# Patient Record
Sex: Female | Born: 1980 | Hispanic: Yes | Marital: Married | Smoking: Never smoker
Health system: Southern US, Community
[De-identification: ages and names within clinical notes are randomized; demographics above are authoritative.]

## PROBLEM LIST (undated history)

## (undated) ENCOUNTER — Inpatient Hospital Stay (HOSPITAL_COMMUNITY): Admission: AD | Payer: Self-pay | Admitting: Obstetrics & Gynecology

## (undated) DIAGNOSIS — E785 Hyperlipidemia, unspecified: Secondary | ICD-10-CM

## (undated) DIAGNOSIS — Z789 Other specified health status: Secondary | ICD-10-CM

## (undated) DIAGNOSIS — D279 Benign neoplasm of unspecified ovary: Secondary | ICD-10-CM

## (undated) HISTORY — DX: Hyperlipidemia, unspecified: E78.5

## (undated) HISTORY — DX: Other specified health status: Z78.9

## (undated) HISTORY — DX: Morbid (severe) obesity due to excess calories: E66.01

## (undated) HISTORY — PX: NO PAST SURGERIES: SHX2092

## (undated) HISTORY — DX: Benign neoplasm of unspecified ovary: D27.9

---

## 2000-02-24 ENCOUNTER — Ambulatory Visit (HOSPITAL_COMMUNITY): Admission: RE | Admit: 2000-02-24 | Discharge: 2000-02-24 | Payer: Self-pay | Admitting: *Deleted

## 2000-02-29 ENCOUNTER — Inpatient Hospital Stay (HOSPITAL_COMMUNITY): Admission: AD | Admit: 2000-02-29 | Discharge: 2000-02-29 | Payer: Self-pay | Admitting: *Deleted

## 2000-06-01 ENCOUNTER — Ambulatory Visit (HOSPITAL_COMMUNITY): Admission: RE | Admit: 2000-06-01 | Discharge: 2000-06-01 | Payer: Self-pay | Admitting: *Deleted

## 2000-07-13 ENCOUNTER — Inpatient Hospital Stay (HOSPITAL_COMMUNITY): Admission: AD | Admit: 2000-07-13 | Discharge: 2000-07-13 | Payer: Self-pay | Admitting: Obstetrics

## 2000-07-13 ENCOUNTER — Inpatient Hospital Stay (HOSPITAL_COMMUNITY): Admission: AD | Admit: 2000-07-13 | Discharge: 2000-07-15 | Payer: Self-pay | Admitting: Obstetrics

## 2000-07-29 ENCOUNTER — Inpatient Hospital Stay (HOSPITAL_COMMUNITY): Admission: AD | Admit: 2000-07-29 | Discharge: 2000-07-29 | Payer: Self-pay | Admitting: Obstetrics & Gynecology

## 2000-11-13 ENCOUNTER — Encounter: Payer: Self-pay | Admitting: Emergency Medicine

## 2000-11-13 ENCOUNTER — Emergency Department (HOSPITAL_COMMUNITY): Admission: EM | Admit: 2000-11-13 | Discharge: 2000-11-13 | Payer: Self-pay | Admitting: Emergency Medicine

## 2000-12-22 ENCOUNTER — Emergency Department (HOSPITAL_COMMUNITY): Admission: EM | Admit: 2000-12-22 | Discharge: 2000-12-23 | Payer: Self-pay | Admitting: Emergency Medicine

## 2000-12-23 ENCOUNTER — Encounter: Payer: Self-pay | Admitting: Emergency Medicine

## 2002-05-22 ENCOUNTER — Inpatient Hospital Stay (HOSPITAL_COMMUNITY): Admission: AD | Admit: 2002-05-22 | Discharge: 2002-05-22 | Payer: Self-pay | Admitting: *Deleted

## 2003-02-21 ENCOUNTER — Other Ambulatory Visit: Admission: RE | Admit: 2003-02-21 | Discharge: 2003-02-21 | Payer: Self-pay | Admitting: Obstetrics and Gynecology

## 2003-02-27 ENCOUNTER — Inpatient Hospital Stay (HOSPITAL_COMMUNITY): Admission: AD | Admit: 2003-02-27 | Discharge: 2003-02-27 | Payer: Self-pay | Admitting: Obstetrics and Gynecology

## 2003-09-28 ENCOUNTER — Inpatient Hospital Stay (HOSPITAL_COMMUNITY): Admission: AD | Admit: 2003-09-28 | Discharge: 2003-09-28 | Payer: Self-pay | Admitting: Obstetrics & Gynecology

## 2003-09-29 ENCOUNTER — Inpatient Hospital Stay (HOSPITAL_COMMUNITY): Admission: AD | Admit: 2003-09-29 | Discharge: 2003-09-29 | Payer: Self-pay | Admitting: Obstetrics & Gynecology

## 2003-10-01 ENCOUNTER — Encounter: Admission: RE | Admit: 2003-10-01 | Discharge: 2003-10-01 | Payer: Self-pay | Admitting: *Deleted

## 2003-10-03 ENCOUNTER — Inpatient Hospital Stay (HOSPITAL_COMMUNITY): Admission: AD | Admit: 2003-10-03 | Discharge: 2003-10-05 | Payer: Self-pay | Admitting: Obstetrics and Gynecology

## 2006-09-23 ENCOUNTER — Inpatient Hospital Stay (HOSPITAL_COMMUNITY): Admission: AD | Admit: 2006-09-23 | Discharge: 2006-09-23 | Payer: Self-pay | Admitting: Gynecology

## 2006-10-05 ENCOUNTER — Inpatient Hospital Stay (HOSPITAL_COMMUNITY): Admission: AD | Admit: 2006-10-05 | Discharge: 2006-10-05 | Payer: Self-pay | Admitting: Obstetrics & Gynecology

## 2006-11-02 ENCOUNTER — Ambulatory Visit: Payer: Self-pay | Admitting: Obstetrics and Gynecology

## 2010-10-03 ENCOUNTER — Encounter: Payer: Self-pay | Admitting: *Deleted

## 2010-10-04 ENCOUNTER — Encounter: Payer: Self-pay | Admitting: Family Medicine

## 2016-11-12 ENCOUNTER — Emergency Department (HOSPITAL_COMMUNITY)
Admission: EM | Admit: 2016-11-12 | Discharge: 2016-11-13 | Disposition: A | Discharge status: Home / Self Care | Payer: No Typology Code available for payment source | Attending: Emergency Medicine | Admitting: Emergency Medicine

## 2016-11-12 ENCOUNTER — Emergency Department (HOSPITAL_COMMUNITY): Payer: No Typology Code available for payment source

## 2016-11-12 ENCOUNTER — Encounter (HOSPITAL_COMMUNITY): Payer: Self-pay | Admitting: Emergency Medicine

## 2016-11-12 DIAGNOSIS — N390 Urinary tract infection, site not specified: Secondary | ICD-10-CM | POA: Diagnosis not present

## 2016-11-12 DIAGNOSIS — M25561 Pain in right knee: Secondary | ICD-10-CM | POA: Diagnosis not present

## 2016-11-12 DIAGNOSIS — R933 Abnormal findings on diagnostic imaging of other parts of digestive tract: Secondary | ICD-10-CM | POA: Diagnosis not present

## 2016-11-12 DIAGNOSIS — R935 Abnormal findings on diagnostic imaging of other abdominal regions, including retroperitoneum: Secondary | ICD-10-CM

## 2016-11-12 DIAGNOSIS — Y939 Activity, unspecified: Secondary | ICD-10-CM | POA: Diagnosis not present

## 2016-11-12 DIAGNOSIS — R109 Unspecified abdominal pain: Secondary | ICD-10-CM | POA: Diagnosis present

## 2016-11-12 DIAGNOSIS — M25562 Pain in left knee: Secondary | ICD-10-CM | POA: Insufficient documentation

## 2016-11-12 DIAGNOSIS — Y92410 Unspecified street and highway as the place of occurrence of the external cause: Secondary | ICD-10-CM | POA: Diagnosis not present

## 2016-11-12 DIAGNOSIS — M549 Dorsalgia, unspecified: Secondary | ICD-10-CM | POA: Diagnosis not present

## 2016-11-12 DIAGNOSIS — Y999 Unspecified external cause status: Secondary | ICD-10-CM | POA: Diagnosis not present

## 2016-11-12 DIAGNOSIS — M542 Cervicalgia: Secondary | ICD-10-CM | POA: Insufficient documentation

## 2016-11-12 LAB — I-STAT CHEM 8, ED
BUN: 13 mg/dL (ref 6–20)
CALCIUM ION: 1.11 mmol/L — AB (ref 1.15–1.40)
Chloride: 105 mmol/L (ref 101–111)
Creatinine, Ser: 0.5 mg/dL (ref 0.44–1.00)
Glucose, Bld: 109 mg/dL — ABNORMAL HIGH (ref 65–99)
HEMATOCRIT: 33 % — AB (ref 36.0–46.0)
HEMOGLOBIN: 11.2 g/dL — AB (ref 12.0–15.0)
Potassium: 3.4 mmol/L — ABNORMAL LOW (ref 3.5–5.1)
SODIUM: 141 mmol/L (ref 135–145)
TCO2: 25 mmol/L (ref 0–100)

## 2016-11-12 LAB — URINALYSIS, ROUTINE W REFLEX MICROSCOPIC
BILIRUBIN URINE: NEGATIVE
Glucose, UA: NEGATIVE mg/dL
Ketones, ur: NEGATIVE mg/dL
Nitrite: NEGATIVE
PROTEIN: 30 mg/dL — AB
SPECIFIC GRAVITY, URINE: 1.026 (ref 1.005–1.030)
pH: 7 (ref 5.0–8.0)

## 2016-11-12 LAB — POC URINE PREG, ED: PREG TEST UR: NEGATIVE

## 2016-11-12 LAB — I-STAT BETA HCG BLOOD, ED (MC, WL, AP ONLY): I-stat hCG, quantitative: 5.2 m[IU]/mL — ABNORMAL HIGH (ref <5)

## 2016-11-12 MED ORDER — HYDROCODONE-ACETAMINOPHEN 5-325 MG PO TABS
1.0000 | ORAL_TABLET | Freq: Once | ORAL | Status: AC
  Administered 2016-11-12: 1 via ORAL
  Filled 2016-11-12: qty 1

## 2016-11-12 MED ORDER — IOPAMIDOL (ISOVUE-300) INJECTION 61%
INTRAVENOUS | Status: AC
  Filled 2016-11-12: qty 100

## 2016-11-12 MED ORDER — IOPAMIDOL (ISOVUE-300) INJECTION 61%
100.0000 mL | Freq: Once | INTRAVENOUS | Status: AC | PRN
  Administered 2016-11-12: 100 mL via INTRAVENOUS
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
## 2016-11-12 NOTE — Discharge Instructions (Signed)
Take ibuprofen and flexeril as needed for your pain.  You have a urinary tract infection, take antibiotic as prescribed for the full duration. On your abdominal and pelvis CT scan, there's evidence of a right adnexal cyst measuring 8.1 x 3.8 cm.  This is an incidental finding however it is recommended that you discussed this finding with your primary care provider and request further evaluation which may include an MRI.

## 2016-11-12 NOTE — ED Notes (Signed)
Pt aware we need urine specimen to check preg status.  Will try in about ten minutes or so.
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
## 2016-11-13 MED ORDER — IBUPROFEN 800 MG PO TABS: 800.0000 mg | ORAL_TABLET | Freq: Three times a day (TID) | ORAL | 0 refills | Status: DC

## 2016-11-13 MED ORDER — CYCLOBENZAPRINE HCL 10 MG PO TABS: 10.0000 mg | ORAL_TABLET | Freq: Two times a day (BID) | ORAL | 0 refills | Status: DC | PRN

## 2016-11-13 MED ORDER — SULFAMETHOXAZOLE-TRIMETHOPRIM 800-160 MG PO TABS: 1.0000 | ORAL_TABLET | Freq: Two times a day (BID) | ORAL | 0 refills | Status: AC
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
## 2016-11-29 ENCOUNTER — Encounter: Payer: Self-pay | Admitting: Obstetrics & Gynecology

## 2016-11-29 ENCOUNTER — Ambulatory Visit (INDEPENDENT_AMBULATORY_CARE_PROVIDER_SITE_OTHER): Payer: Self-pay | Admitting: Obstetrics & Gynecology

## 2016-11-29 VITALS — BP 149/93 | HR 96 | Ht 59.45 in | Wt 223.1 lb

## 2016-11-29 DIAGNOSIS — N83201 Unspecified ovarian cyst, right side: Secondary | ICD-10-CM
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
## 2016-11-29 NOTE — Patient Instructions (Signed)
Quiste ovrico (Ovarian Cyst) Un quiste ovrico es una bolsa llena de lquido que se forma en el ovario. Los ovarios son los rganos pequeos que producen vulos en las mujeres. Se pueden formar varios tipos de [employer]. Algunos pueden provocar sntomas y requerir Clinical research associate. La mayora de los quistes ovricos desaparecen por s solos, no son cancerosos (son benignos) y no causan problemas. Los tipos ms comunes de quistes ovricos son los siguientes:  Quistes funcionales (folculos).  Ocurren durante el ciclo menstrual y suelen desaparecer con el siguiente ciclo menstrual si no queda embarazada.  No suelen causar sntomas.  Endometriomas.  Son quistes formados por el tejido que recubre el tero (endometrio).  A veces se denominan "quistes de chocolate" porque se llenan de sangre que se vuelve marrn.  Este tipo de quiste puede provocar dolor en la zona inferior del abdomen durante la relacin sexual y el perodo menstrual.  Cistoadenomas.  Se desarrollan a partir de clulas que se encuentran en la superficie externa del ovario.  Pueden agrandarse mucho y causar dolor en la zona inferior del abdomen y con la relacin sexual.  Pueden provocar dolor intenso si se tuercen o se rompen (ruptura).  Quistes dermoides.  A veces se encuentran en ambos ovarios.  Estos quistes pueden [employer] tipos de tejidos del organismo, como piel, dientes, pelo o Database administrator.  Generalmente no tienen sntomas, a menos que sean muy grandes.  Quistes tecalutesticos.  Aparecen cuando se produce demasiada cantidad de cierta hormona (gonadotropina corinica humana) que estimula en exceso al ovario para que produzca vulos.  Son ms frecuentes despus de procedimientos que ayudan a la concepcin de un beb (fertilizacin in vitro). CAUSAS Algunas de las causas de los quistes ovricos son las siguientes:  Sndrome de hiperestimulacin ovrica. Esta es una afeccin que puede  aparecer debido a la toma de medicamentos para la fertilidad. Provoca la formacin de varios quistes ovricos grandes.  Sndrome de ovario poliqustico (SOP). Este es un trastorno hormonal frecuente que puede producir quistes ovricos, adems de problemas en el perodo o la fertilidad. Zortman factores pueden hacer que usted sea ms propensa a desarrollar quistes ovricos:  Tener exceso de [place] u obesidad.  Tomar medicamentos para la fertilidad.  Usar ciertas formas de regulacin hormonal de la natalidad.  Fumar. SNTOMAS Muchos quistes ovricos no causan sntomas. Si se presentan sntomas, estos pueden ser:  Dolor o molestias en la pelvis.  Dolor en la parte baja del abdomen.  [place].  [place]inch[place]n abdominal.  Perodos menstruales anormales.  Aumento del [employer] perodos [place]. DIAGNSTICO Estos quistes se descubren comnmente durante un examen de rutina o una exploracin ginecolgica. Puede realizarse exmenes para obtener ms informacin sobre el [place], como los siguientes:  Magazine features editor.  Radiografas de la pelvis.  Tomografa computarizada (TC).  Resonancia magntica (RM).  Anlisis de Jacobus. TRATAMIENTO Muchos de los quistes ovricos desaparecen por s solos, sin tratamiento. Es probable que el mdico quiera controlar el quiste regularmente durante 2 o 57mses para ver si se produce algn cambio. Si est en la menopausia, es especialmente importante controlar el quiste cuidadosamente porque las mujeres menopusicas tienen un ndice mayor de cncer de ovario. Si se necesita tratamiento, este puede incluir lo siguiente:  Tomar medicamentos para aBest boy  Un procedimiento para drenar el quiste (aspiracin).  [place] para extirpar el quiste completo.  Tratamiento hormonal o pldoras anticonceptivas. Estos mtodos a veces se usan para ayudar a dCabin crew  quiste. [place] los medicamentos de venta libre y los recetados solamente como se lo haya indicado el mdico.  No conduzca ni use maquinaria pesada mientras toma analgsicos recetados.  Realcese exmenes plvicos peridicos y pruebas de Papanicolaou con la frecuencia que le indique el mdico.  Retome sus actividades normales como se lo haya indicado el mdico. Pregntele al mdico qu actividades son seguras para usted.  No consuma ningn producto que contenga tabaco o nicotina, como cigarrillos y Psychologist, sport and exercise. Si necesita ayuda para dejar de fumar, consulte al mdico.  Concurra a todas las visitas de control como se lo haya indicado el mdico. Esto es importante. SOLICITE ATENCIN MDICA SI:  Los perodos se atrasan, son irregulares, dolorosos o cesan.  Tiene dolor plvico que no desaparece.  Siente presin en la vejiga o no puede vaciarla completamente.  Siente dolor durante las [employer].  Tiene alguno de los siguientes sntomas en el abdomen:  Sensacin de tener el estmago lleno.  Presin.  Molestias.  Dolor que persiste.  Hinchazn.  Siente un Pharmacist, hospital.  Tiene estreimiento.  Pierde el apetito.  Presenta acn grave.  Empieza a tener ms bello corporal y facial.  Aumenta o disminuye de peso sin hacer modificaciones en su actividad fsica y en su dieta habitual.  Cree que puede estar [place]. SOLICITE ATENCIN MDICA DE INMEDIATO SI:  Tiene un dolor abdominal intenso o que empeora.  No puede comer ni beber sin vomitar.  Tiene fiebre de [place] repentina.  Su perodo menstrual es mucho ms profuso que lo normal. Esta informacin no tiene Marine scientist el consejo del mdico. Asegrese de hacerle al mdico cualquier pregunta que tenga. Document Released: 06/09/2005 Document Revised: 09/04/2013 Document Reviewed: 02/01/2016 Elsevier Interactive Patient Education  2017 [employer].

## 2017-01-13 ENCOUNTER — Ambulatory Visit (HOSPITAL_COMMUNITY)
Admission: RE | Admit: 2017-01-13 | Discharge: 2017-01-13 | Disposition: A | Discharge status: Home / Self Care | Payer: Self-pay | Source: Ambulatory Visit | Attending: Obstetrics & Gynecology | Admitting: Obstetrics & Gynecology

## 2017-01-13 DIAGNOSIS — N83201 Unspecified ovarian cyst, right side: Secondary | ICD-10-CM | POA: Insufficient documentation

## 2017-01-13 DIAGNOSIS — N83292 Other ovarian cyst, left side: Secondary | ICD-10-CM | POA: Insufficient documentation

## 2017-01-14 ENCOUNTER — Encounter: Payer: Self-pay | Admitting: *Deleted

## 2017-01-14 NOTE — Progress Notes (Signed)
Jamie from u/s called clinic with u/s results. Dr Roselie Awkward notified via inbox to review.

## 2017-01-18 ENCOUNTER — Telehealth: Payer: Self-pay

## 2017-01-18 NOTE — Telephone Encounter (Signed)
-----   Message from Woodroe Mode, MD sent at 01/17/2017  7:20 PM EDT ----- Persistent ovarian cyst, schedule f/u appt to discuss with the patient

## 2017-01-18 NOTE — Telephone Encounter (Signed)
Called patient- no answer or voicemail to leave a message. 

## 2017-02-16 ENCOUNTER — Ambulatory Visit (INDEPENDENT_AMBULATORY_CARE_PROVIDER_SITE_OTHER): Payer: Self-pay | Admitting: Obstetrics & Gynecology

## 2017-02-16 VITALS — BP 136/67 | HR 85 | Wt 244.1 lb

## 2017-02-16 DIAGNOSIS — N83201 Unspecified ovarian cyst, right side: Secondary | ICD-10-CM

## 2017-02-16 NOTE — Progress Notes (Signed)
Subjective:     Patient ID: Kathleen Cline, female   DOB: 02-23-1981, 36 y.o.   MRN: [id number] Cc: f/u repeat US for ovarian cyst HPI [id number] No LMP recorded. F/u after repeat US for right ovarian cyst. Still no problem with pain   Review of Systems  Constitutional: Negative.   Gastrointestinal: Negative.   Genitourinary: Negative.   Musculoskeletal: Positive for joint swelling (some ankle edema).       Objective:   Physical Exam  Constitutional: She appears well-developed. No distress.  Psychiatric: She has a normal mood and affect. Her behavior is normal.   CLINICAL DATA:  Follow-up right ovarian cyst seen on CT exam.  EXAM: TRANSABDOMINAL AND TRANSVAGINAL ULTRASOUND OF PELVIS  TECHNIQUE: Both transabdominal and transvaginal ultrasound examinations of the pelvis were performed. Transabdominal technique was performed for global imaging of the pelvis including uterus, ovaries, adnexal regions, and pelvic cul-de-sac. It was necessary to proceed with endovaginal exam following the transabdominal exam to visualize the endometrium.  COMPARISON:  CT abdomen and pelvis on 11/12/2016  FINDINGS: Uterus  Measurements: 10.7 x 5.8 x 8.3 cm. Uterus is heterogeneous.  Endometrium  Thickness: 18 mm Heterogeneous in appearance.  Right ovary  Measurements: 7.9 x 7.4 x 9.2 cm. An anechoic cyst is identified within the right ovary measuring 7.1 x 6.4 x 8.1 cm. A second component previously identified on the CT exam measuring 3.8 cm is not identified on the current study.  Left ovary  Measurements: 4.9 x 3.8 x 5.2 cm. Anechoic cyst in the left ovary is 3.8 x 3.4 x 3.9 cm, new since the prior study.  Other findings  No abnormal free fluid.  IMPRESSION: 1. Persistent large right adnexal cyst measuring at least 8.1 cm. A second component is either obscured sonographically or has resolved. Given the size and persistence of this lesion, further characterization  with MRI is warranted. 2. Simple cyst in the left adnexa is likely benign. No specific follow-up is felt to be necessary for this lesion based on consensus criteria. 3. Thickened endometrial stripe measuring 18 mm. Endometrial thickness is considered abnormal. Consider follow-up by [place] in 6-8 weeks, during the week immediately following menses (exam timing is critical). These results will be called to the ordering clinician or representative by the Radiologist Assistant, and communication documented in the PACS or zVision Dashboard.   Electronically Signed   By: Nolon Nations M.D.   On: 01/13/2017 14:57       Plan:     Repeat US in 1 months and order CA 125     1. Ovarian cyst, right Benign feature and stable - US Transvaginal Non-OB; Future - CA 125    Emeterio Reeve, MD

## 2017-02-16 NOTE — Patient Instructions (Signed)
Quiste ovrico (Ovarian Cyst) Un quiste ovrico es una bolsa llena de lquido que se forma en el ovario. Los ovarios son los rganos pequeos que producen vulos en las mujeres. Se pueden formar varios tipos de quistes en los ovarios. Algunos pueden provocar sntomas y requerir tratamiento. La mayora de los quistes ovricos desaparecen por s solos, no son cancerosos (son benignos) y no causan problemas. Los tipos ms comunes de quistes ovricos son los siguientes:  Quistes funcionales (folculos). ? Ocurren durante el ciclo menstrual y suelen desaparecer con el siguiente ciclo menstrual si no queda embarazada. ? No suelen causar sntomas.  Endometriomas. ? Son quistes formados por el tejido que recubre el tero (endometrio). ? A veces se denominan "quistes de chocolate" porque se llenan de sangre que se vuelve marrn. ? Este tipo de quiste puede provocar dolor en la zona inferior del abdomen durante la relacin sexual y el perodo menstrual.  Cistoadenomas. ? Se desarrollan a partir de clulas que se encuentran en la superficie externa del ovario. ? Pueden agrandarse mucho y causar dolor en la zona inferior del abdomen y con la relacin sexual. ? Pueden provocar dolor intenso si se tuercen o se rompen (ruptura).  Quistes dermoides. ? A veces se encuentran en ambos ovarios. ? Estos quistes pueden contener diferentes tipos de tejidos del organismo, como piel, dientes, pelo o cartlago. ? Generalmente no tienen sntomas, a menos que sean muy grandes.  Quistes tecalutesticos. ? Aparecen cuando se produce demasiada cantidad de cierta hormona (gonadotropina corinica humana) que estimula en exceso al ovario para que produzca vulos. ? Son ms frecuentes despus de procedimientos que ayudan a la concepcin de un beb (fertilizacin in vitro). CAUSAS Algunas de las causas de los quistes ovricos son las siguientes:  Sndrome de hiperestimulacin ovrica. Esta es una afeccin que puede  aparecer debido a la toma de medicamentos para la fertilidad. Provoca la formacin de varios quistes ovricos grandes.  Sndrome de ovario poliqustico (SOP). Este es un trastorno hormonal frecuente que puede producir quistes ovricos, adems de problemas en el perodo o la fertilidad. FACTORES DE RIESGO Los siguientes factores pueden hacer que usted sea ms propensa a desarrollar quistes ovricos:  Tener exceso de peso u obesidad.  Tomar medicamentos para la fertilidad.  Usar ciertas formas de regulacin hormonal de la natalidad.  Fumar. SNTOMAS Muchos quistes ovricos no causan sntomas. Si se presentan sntomas, estos pueden ser:  Dolor o molestias en la pelvis.  Dolor en la parte baja del abdomen.  Dolor durante las relaciones sexuales.  Hinchazn abdominal.  Perodos menstruales anormales.  Aumento del dolor en los perodos menstruales. DIAGNSTICO Estos quistes se descubren comnmente durante un examen de rutina o una exploracin ginecolgica. Puede realizarse exmenes para obtener ms informacin sobre el quiste, como los siguientes:  Ecografa.  Radiografas de la pelvis.  Tomografa computarizada (TC).  Resonancia magntica (RM).  Anlisis de sangre. TRATAMIENTO Muchos de los quistes ovricos desaparecen por s solos, sin tratamiento. Es probable que el mdico quiera controlar el quiste regularmente durante 2 o 3meses para ver si se produce algn cambio. Si est en la menopausia, es especialmente importante controlar el quiste cuidadosamente porque las mujeres menopusicas tienen un ndice mayor de cncer de ovario. Si se necesita tratamiento, este puede incluir lo siguiente:  Tomar medicamentos para aliviar el dolor.  Un procedimiento para drenar el quiste (aspiracin).  Ciruga para extirpar el quiste completo.  Tratamiento hormonal o pldoras anticonceptivas. Estos mtodos a veces se usan para ayudar a disolver un   quiste. INSTRUCCIONES PARA EL CUIDADO  EN EL HOGAR  Tome los medicamentos de venta libre y los recetados solamente como se lo haya indicado el mdico.  No conduzca ni use maquinaria pesada mientras toma analgsicos recetados.  Realcese exmenes plvicos peridicos y pruebas de Papanicolaou con la frecuencia que le indique el mdico.  Retome sus actividades normales como se lo haya indicado el mdico. Pregntele al mdico qu actividades son seguras para usted.  No consuma ningn producto que contenga tabaco o nicotina, como cigarrillos y cigarrillos electrnicos. Si necesita ayuda para dejar de fumar, consulte al mdico.  Concurra a todas las visitas de control como se lo haya indicado el mdico. Esto es importante. SOLICITE ATENCIN MDICA SI:  Los perodos se atrasan, son irregulares, dolorosos o cesan.  Tiene dolor plvico que no desaparece.  Siente presin en la vejiga o no puede vaciarla completamente.  Siente dolor durante las relaciones sexuales.  Tiene alguno de los siguientes sntomas en el abdomen: ? Sensacin de tener el estmago lleno. ? Presin. ? Molestias. ? Dolor que persiste. ? Hinchazn.  Siente un malestar generalizado.  Tiene estreimiento.  Pierde el apetito.  Presenta acn grave.  Empieza a tener ms bello corporal y facial.  Aumenta o disminuye de peso sin hacer modificaciones en su actividad fsica y en su dieta habitual.  Cree que puede estar embarazada. SOLICITE ATENCIN MDICA DE INMEDIATO SI:  Tiene un dolor abdominal intenso o que empeora.  No puede comer ni beber sin vomitar.  Tiene fiebre de manera repentina.  Su perodo menstrual es mucho ms profuso que lo normal. Esta informacin no tiene como fin reemplazar el consejo del mdico. Asegrese de hacerle al mdico cualquier pregunta que tenga. Document Released: 06/09/2005 Document Revised: 09/04/2013 Document Reviewed: 02/01/2016 Elsevier Interactive Patient Education  2018 Elsevier Inc.  

## 2017-02-17 LAB — CA 125: CA 125: 8.9 U/mL (ref 0.0–38.1)

## 2017-02-21 ENCOUNTER — Ambulatory Visit (HOSPITAL_COMMUNITY)
Admission: RE | Admit: 2017-02-21 | Discharge: 2017-02-21 | Disposition: A | Discharge status: Home / Self Care | Payer: Self-pay | Source: Ambulatory Visit | Attending: Obstetrics & Gynecology | Admitting: Obstetrics & Gynecology

## 2017-02-21 DIAGNOSIS — N83202 Unspecified ovarian cyst, left side: Secondary | ICD-10-CM | POA: Insufficient documentation

## 2017-02-21 DIAGNOSIS — N83201 Unspecified ovarian cyst, right side: Secondary | ICD-10-CM

## 2017-03-21 ENCOUNTER — Telehealth: Payer: Self-pay | Admitting: *Deleted

## 2017-03-21 ENCOUNTER — Encounter: Payer: Self-pay | Admitting: *Deleted

## 2017-03-21 NOTE — Telephone Encounter (Signed)
-----   Message from Woodroe Mode, MD sent at 03/19/2017  2:54 PM EDT ----- Right ovarian cyst resolved

## 2017-03-21 NOTE — Telephone Encounter (Signed)
I called Kathleen Cline with [employer] [phone]- called home number and heard a message this number is not accepting calls at this time- unable to leave a message. Called her work number and was told she does not work there. Will send letter.

## 2017-04-01 ENCOUNTER — Encounter: Payer: Self-pay | Admitting: Obstetrics & Gynecology

## 2017-04-01 ENCOUNTER — Ambulatory Visit (INDEPENDENT_AMBULATORY_CARE_PROVIDER_SITE_OTHER): Payer: Self-pay | Admitting: Obstetrics & Gynecology

## 2017-04-01 VITALS — BP 135/75 | HR 97 | Wt 244.6 lb

## 2017-04-01 DIAGNOSIS — N83202 Unspecified ovarian cyst, left side: Secondary | ICD-10-CM

## 2017-04-01 NOTE — Progress Notes (Signed)
   Subjective:    Patient ID: Kathleen Cline, female    DOB: 1981-01-14, 36 y.o.   MRN: [id number]  HPI  36 yo W H P2 here for follow up of an ovarian cyst seen on CT. She is interested in another pregnancy. She is not having any pain.  Her follow up u/s on 02-21-17 showed the following:   1. Resolution of previous right ovary cyst. 2. Persistent left ovary cyst measuring 4 cm. This is almost certainly benign, and no specific imaging follow up is recommended according to the Society of Radiologists in [place] Statement (D Clovis Riley et al. Management of Asymptomatic Ovarian and Other Adnexal Cysts Imaged at [place]: Society of Radiologists in [place] Statement 2010. Radiology 256 (Sept 2010): 872-761.).   Review of Systems [facility] and was normal.    Objective:   Physical Exam Morbidly obese HFNAD Breathing, conversing, and ambulating normally Video interpretor used for exam       Assessment & Plan:  Persistent Benign left ovarian cyst- reassurance given I gave her the phone number for Dr. Kerin Perna prn

## 2017-06-11 ENCOUNTER — Emergency Department (HOSPITAL_COMMUNITY): Payer: No Typology Code available for payment source

## 2017-06-11 ENCOUNTER — Encounter (HOSPITAL_COMMUNITY): Payer: Self-pay | Admitting: Emergency Medicine

## 2017-06-11 ENCOUNTER — Emergency Department (HOSPITAL_COMMUNITY)
Admission: EM | Admit: 2017-06-11 | Discharge: 2017-06-11 | Disposition: A | Discharge status: Home / Self Care | Payer: No Typology Code available for payment source | Attending: Emergency Medicine | Admitting: Emergency Medicine

## 2017-06-11 DIAGNOSIS — S82401A Unspecified fracture of shaft of right fibula, initial encounter for closed fracture: Secondary | ICD-10-CM | POA: Insufficient documentation

## 2017-06-11 DIAGNOSIS — R079 Chest pain, unspecified: Secondary | ICD-10-CM | POA: Insufficient documentation

## 2017-06-11 DIAGNOSIS — Y999 Unspecified external cause status: Secondary | ICD-10-CM | POA: Diagnosis not present

## 2017-06-11 DIAGNOSIS — S80212A Abrasion, left knee, initial encounter: Secondary | ICD-10-CM | POA: Diagnosis not present

## 2017-06-11 DIAGNOSIS — Y92410 Unspecified street and highway as the place of occurrence of the external cause: Secondary | ICD-10-CM | POA: Insufficient documentation

## 2017-06-11 DIAGNOSIS — Z23 Encounter for immunization: Secondary | ICD-10-CM | POA: Insufficient documentation

## 2017-06-11 DIAGNOSIS — S8991XA Unspecified injury of right lower leg, initial encounter: Secondary | ICD-10-CM | POA: Diagnosis present

## 2017-06-11 DIAGNOSIS — R109 Unspecified abdominal pain: Secondary | ICD-10-CM | POA: Insufficient documentation

## 2017-06-11 DIAGNOSIS — Y9389 Activity, other specified: Secondary | ICD-10-CM | POA: Diagnosis not present

## 2017-06-11 DIAGNOSIS — S82409A Unspecified fracture of shaft of unspecified fibula, initial encounter for closed fracture: Secondary | ICD-10-CM

## 2017-06-11 DIAGNOSIS — S32009A Unspecified fracture of unspecified lumbar vertebra, initial encounter for closed fracture: Secondary | ICD-10-CM

## 2017-06-11 DIAGNOSIS — S32039A Unspecified fracture of third lumbar vertebra, initial encounter for closed fracture: Secondary | ICD-10-CM | POA: Insufficient documentation

## 2017-06-11 DIAGNOSIS — T07XXXA Unspecified multiple injuries, initial encounter: Secondary | ICD-10-CM

## 2017-06-11 DIAGNOSIS — R55 Syncope and collapse: Secondary | ICD-10-CM | POA: Insufficient documentation

## 2017-06-11 LAB — I-STAT CHEM 8, ED
BUN: 9 mg/dL (ref 6–20)
CHLORIDE: 102 mmol/L (ref 101–111)
Calcium, Ion: 1.07 mmol/L — ABNORMAL LOW (ref 1.15–1.40)
Creatinine, Ser: 0.6 mg/dL (ref 0.44–1.00)
Glucose, Bld: 183 mg/dL — ABNORMAL HIGH (ref 65–99)
HEMATOCRIT: 37 % (ref 36.0–46.0)
Hemoglobin: 12.6 g/dL (ref 12.0–15.0)
Potassium: 3.6 mmol/L (ref 3.5–5.1)
SODIUM: 137 mmol/L (ref 135–145)
TCO2: 23 mmol/L (ref 22–32)

## 2017-06-11 LAB — I-STAT BETA HCG BLOOD, ED (MC, WL, AP ONLY)

## 2017-06-11 MED ORDER — IOPAMIDOL (ISOVUE-300) INJECTION 61%
INTRAVENOUS | Status: AC
  Administered 2017-06-11: 100 mL
  Filled 2017-06-11: qty 100

## 2017-06-11 MED ORDER — OXYCODONE-ACETAMINOPHEN 5-325 MG PO TABS
2.0000 | ORAL_TABLET | Freq: Once | ORAL | Status: AC
  Administered 2017-06-11: 2 via ORAL
  Filled 2017-06-11: qty 2

## 2017-06-11 MED ORDER — TETANUS-DIPHTH-ACELL PERTUSSIS 5-2.5-18.5 LF-MCG/0.5 IM SUSP
0.5000 mL | Freq: Once | INTRAMUSCULAR | Status: AC
  Administered 2017-06-11: 0.5 mL via INTRAMUSCULAR
  Filled 2017-06-11: qty 0.5

## 2017-06-11 MED ORDER — OXYCODONE-ACETAMINOPHEN 5-325 MG PO TABS: 1.0000 | ORAL_TABLET | Freq: Four times a day (QID) | ORAL | 0 refills | Status: DC | PRN

## 2017-06-11 NOTE — ED Notes (Signed)
Patient transported to CT 

## 2017-06-11 NOTE — Progress Notes (Signed)
Orthopedic Tech Progress Note Patient Details:  Kathleen Cline 10/18/1980 [id number]  Patient ID: Kathleen Cline, female   DOB: 02-Apr-1981, 36 y.o.   MRN: [id number]   Hildred Priest 06/11/2017, 11:23 AM RN stated that doctor conveyed that pt cannot use crutches

## 2017-06-11 NOTE — ED Notes (Signed)
Patient transported to MRI 

## 2017-06-11 NOTE — Discharge Instructions (Addendum)
Follow up with your doctor about the cyst on your ovary. Return to the ED if you develop new or worsening symptoms.

## 2017-06-11 NOTE — ED Notes (Signed)
Pt states she hurts too bad to get up and walk at present-- will give pain meds time to work.
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
## 2017-06-11 NOTE — ED Notes (Signed)
Pt used bedpan -- bedpan spilled in bed, sheets changed, pt hygiene done.

## 2017-06-11 NOTE — Progress Notes (Signed)
Orthopedic Tech Progress Note Patient Details:  Kathleen Cline 10-06-1980 [id number]  Ortho Devices Type of Ortho Device: CAM walker Ortho Device/Splint Location: rle Ortho Device/Splint Interventions: Application   Hillary Schwegler 06/11/2017, 9:04 AM

## 2017-06-11 NOTE — ED Notes (Signed)
Attempted to walk pt. Pt will not put any weight on right leg-- attempted to use walker without success. Dr informed.

## 2017-06-11 NOTE — ED Notes (Signed)
MRI notified-- stated pt is still in waiting list for test-- will get as soon as possible
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
## 2017-08-22 ENCOUNTER — Ambulatory Visit: Payer: Self-pay | Admitting: Obstetrics & Gynecology

## 2017-09-20 ENCOUNTER — Encounter (INDEPENDENT_AMBULATORY_CARE_PROVIDER_SITE_OTHER): Payer: Self-pay | Admitting: Physician Assistant

## 2017-09-20 ENCOUNTER — Ambulatory Visit (INDEPENDENT_AMBULATORY_CARE_PROVIDER_SITE_OTHER): Payer: Self-pay | Admitting: Physician Assistant

## 2017-09-20 VITALS — BP 142/86 | HR 88 | Temp 97.9°F | Resp 18 | Ht 59.0 in | Wt 243.0 lb

## 2017-09-20 DIAGNOSIS — N83202 Unspecified ovarian cyst, left side: Secondary | ICD-10-CM

## 2017-09-20 DIAGNOSIS — D2121 Benign neoplasm of connective and other soft tissue of right lower limb, including hip: Secondary | ICD-10-CM

## 2017-09-20 DIAGNOSIS — R030 Elevated blood-pressure reading, without diagnosis of hypertension: Secondary | ICD-10-CM

## 2017-09-20 NOTE — Progress Notes (Signed)
Subjective:  Patient ID: Kathleen Cline, female    DOB: [birth year]  Age: 37 y.o. MRN: [id number]  CC:   HPI Kathleen Cline is a 37 y.o. female with a medical history of plantar fasciitis, fracture of right fibula, left/right ovarian cysts, and acute back pain with sciatica presents to discuss ovarian cysts. Had gone to the women's clinic and had pelvic US done. She was found to have resolution of the right cyst. Has a 4 cm left ovarian cyst that appears benign. Patient does not feel any pressure or pain. Says she has had a episode of irregular menses. Worried the cyst is causing menstrual irregularities or is malignant.     Patient also has a slowly enlarging skin tag on the right outer thigh. Not painful but bothersome and visually unappealing. No associated erythema, suppuration, bleeding, crusting, or scaling.        Outpatient Medications Prior to Visit  Medication Sig Dispense Refill  . cyclobenzaprine (FLEXERIL) 10 MG tablet Take 1 tablet (10 mg total) by mouth 2 (two) times daily as needed for muscle spasms. 20 tablet 0  . ibuprofen (ADVIL,MOTRIN) 800 MG tablet Take 1 tablet (800 mg total) by mouth 3 (three) times daily. 21 tablet 0  . oxyCODONE-acetaminophen (PERCOCET) 5-325 MG tablet Take 1-2 tablets by mouth every 6 (six) hours as needed. 15 tablet 0   No facility-administered medications prior to visit.      ROS Review of Systems  Constitutional: Negative for chills, fever and malaise/fatigue.  Eyes: Negative for blurred vision.  Respiratory: Negative for shortness of breath.   Cardiovascular: Negative for chest pain and palpitations.  Gastrointestinal: Negative for abdominal pain and nausea.  Genitourinary: Negative for dysuria and hematuria.  Musculoskeletal: Negative for joint pain and myalgias.  Skin: Negative for rash.       Large skin mass  Neurological: Negative for tingling and headaches.  Psychiatric/Behavioral: Negative for depression. The patient is  not nervous/anxious.     Objective:  BP (!) 142/86 (BP Location: Right Arm, Patient Position: Sitting, Cuff Size: Large)   Pulse 88   Temp 97.9 F (36.6 C) (Oral)   Resp 18   Ht 4\' 11"  (1.499 m)   Wt 243 lb (110.2 kg)   LMP 09/02/2017   SpO2 99%   BMI 49.08 kg/m   BP/Weight 09/20/2017 06/11/2017 2/45/8099  Systolic BP 833 825 053  Diastolic BP 86 62 75  Wt. (Lbs) 243 - 244.6  BMI 49.08 - 48.66      Physical Exam  Constitutional: She is oriented to person, place, and time.  Well developed, obese, NAD, polite  HENT:  Head: Normocephalic and atraumatic.  Eyes: No scleral icterus.  Neck: Normal range of motion. Neck supple. No thyromegaly present.  Pulmonary/Chest: Effort normal.  Musculoskeletal: She exhibits no edema.  Neurological: She is alert and oriented to person, place, and time.  Skin: Skin is warm and dry. No rash noted. No erythema. No pallor.  Large protruding mass on right lateral thigh measuring approximately 2 cm height and 1.4 cm in diameter, somewhat cylindrical in shape, skin colored, and soft. No suppuration, erythema, bleeding, crusting, or scaling.  Psychiatric: She has a normal mood and affect. Her behavior is normal. Thought content normal.  Vitals reviewed.    Assessment & Plan:   1. Cyst of left ovary - I have advised patient to have reimaging of cyst in 6 months.  - US Pelvis Complete; Future - US Transvaginal Non-OB; Future  2. Fibroma of right thigh - Large fibroma will need excision with cauterization.  - Ambulatory referral to Dermatology  3. Elevated blood pressure reading without diagnosis of hypertension   Follow-up: Return in about 4 weeks (around 10/18/2017) for annual exam.   Clent Demark PA

## 2017-09-20 NOTE — Patient Instructions (Signed)
Quiste ovrico  Ovarian Cyst  Un quiste ovrico es una bolsa llena de lquido que se forma en el ovario. Los ovarios son rganos de las mujeres que producen vulos. La mayora de los quistes ovricos desaparecen por s solos y no son cancerosos (son benignos). Otros quistes necesitan tratamiento.  Siga estas indicaciones en su casa:   Tome los medicamentos de venta libre y los recetados solamente como se lo haya indicado el mdico.   No conduzca ni use maquinaria pesada mientras toma analgsicos recetados.   Realcese exmenes plvicos y pruebas de Papanicolaou con la frecuencia que le haya indicado el mdico.   Retome sus actividades habituales como se lo haya indicado el mdico. Pregntele al mdico qu actividades son seguras para usted.   No consuma ningn producto que contenga nicotina o tabaco, como cigarrillos y cigarrillos electrnicos. Si necesita ayuda para dejar de fumar, consulte al mdico.   Concurra a todas las visitas de control como se lo haya indicado el mdico. Esto es importante.  Comunquese con un mdico si:   Los perodos menstruales:  ? Se retrasan.  ? Son irregulares.  ? Son dolorosos.   Sus perodos cesan.   Tiene dolor plvico que no desaparece.   Siente presin en la vejiga.   Tiene dificultad para vaciar la vejiga cuando orina.   Siente dolor durante las relaciones sexuales.   Le aparece alguno de los siguientes sntomas en el abdomen:  ? Sensacin de tener el estmago lleno.  ? Presin.  ? Molestias.  ? Dolor que persiste.  ? Hinchazn.   Se siente mal constantemente.   Tiene dificultades para defecar (estreimiento).   No tiene el apetito habitual (pierde el apetito).   Tiene un acn muy grave.   Nota un aumento del vello corporal y facial.   Aumenta o disminuye de peso sin hacer modificaciones en su actividad fsica y en su dieta habitual.   Cree que puede estar embarazada.  Solicite ayuda de inmediato si:   Siente en el abdomen un dolor muy intenso o que  empeora.   No puede comer ni beber sin vomitar.   Sbitamente tiene fiebre.   Los perodos menstruales son ms abundantes de lo habitual.  Esta informacin no tiene como fin reemplazar el consejo del mdico. Asegrese de hacerle al mdico cualquier pregunta que tenga.  Document Released: 06/20/2013 Document Revised: 12/04/2016 Document Reviewed: 02/01/2016  [employer]vier Interactive Patient Education  2018 [employer]

## 2017-09-23 ENCOUNTER — Ambulatory Visit: Payer: Self-pay | Admitting: Obstetrics & Gynecology

## 2017-10-05 ENCOUNTER — Ambulatory Visit: Payer: Self-pay | Attending: Internal Medicine

## 2017-10-18 ENCOUNTER — Ambulatory Visit (INDEPENDENT_AMBULATORY_CARE_PROVIDER_SITE_OTHER): Payer: Self-pay | Admitting: Physician Assistant

## 2017-10-18 ENCOUNTER — Encounter (INDEPENDENT_AMBULATORY_CARE_PROVIDER_SITE_OTHER): Payer: Self-pay | Admitting: Physician Assistant

## 2017-10-18 VITALS — BP 131/82 | HR 93 | Temp 97.9°F | Resp 18 | Ht 60.0 in | Wt 245.0 lb

## 2017-10-18 DIAGNOSIS — Z114 Encounter for screening for human immunodeficiency virus [HIV]: Secondary | ICD-10-CM

## 2017-10-18 DIAGNOSIS — Z0000 Encounter for general adult medical examination without abnormal findings: Secondary | ICD-10-CM

## 2017-10-18 DIAGNOSIS — Z23 Encounter for immunization: Secondary | ICD-10-CM

## 2017-10-18 DIAGNOSIS — Z6841 Body Mass Index (BMI) 40.0 and over, adult: Secondary | ICD-10-CM

## 2017-10-18 NOTE — Patient Instructions (Signed)
Prevenir el aumento de peso no saludable, en adultos Preventing Unhealthy Weight Gain, Adult Mantener un peso saludable es importante. Cuando la grasa se acumula en el cuerpo, usted puede convertirse en obeso o tener sobrepeso. Estos problemas hacen que tenga un mayor riesgo de desarrollar ciertos problemas de salud, como enfermedad cardaca, diabetes, problemas para dormir, problemas articulares y ciertos tipos de cnceres. El aumento de peso no saludable suele ser es el resultado de tomar decisiones poco saludables en lo que respecta a lo que come. Tambin es el resultado de no hacer suficiente ejercicio fsico. Puede hacer cambios en su estilo de vida para prevenir la obesidad y mantenerse tan saludable como sea posible. Qu cambios nutricionales se pueden realizar? Para mantener un peso saludable y prevenir la obesidad:  Coma solo la cantidad que el cuerpo necesita. Haga lo siguiente: ? Preste atencin a los signos de que tiene hambre o que se siente satisfecho. Deje de comer tan pronto como se sienta satisfecho. ? Si tiene hambre, primero beba agua. Beba suficiente agua para que la orina sea clara o de color amarillo plido. ? Coma porciones ms pequeas. ? Consulte los tamaos de las porciones en las etiquetas de los alimentos. La mayora de los alimentos contiene ms de una porcin por envase. ? Consuma la cantidad recomendada de caloras segn su sexo y nivel de actividad. Si bien la mayora de las personas activas deben consumir alrededor de 2000 caloras por da, si ests tratando de perder peso o si no es muy activo, debe consumir menos caloras. Hable con el mdico o el nutricionista sobre cuntas caloras debe consumir por da.  Elija alimentos saludables, por ejemplo: ? Frutas y verduras. Trate de que al menos la mitad del plato de cada comida sea de frutas y verduras. ? Cereales integrales, como el pan integral, el arroz integral y la quinua. ? Carnes magras, como la carne de ave o  pescado. ? Otras protenas saludables, como los frijoles, los huevos o el tofu. ? Grasas saludables, como las nueces, las semillas, los pescados grasos y el aceite de oliva. ? Productos lcteos con bajo contenido de grasa o sin grasa.  Revise las etiquetas de los alimentos y evite los alimentos y bebidas que: ? Contienen muchas caloras. ? Tienen agregados de azcar. ? Contienen mucho sodio. ? Tienen grasas saturadas o grasas trans.  Limite la cantidad que consume de los siguientes alimentos: ? Comidas envasadas. ? Comida rpida. ? Comidas fritas. ? Carnes procesadas, como tocino, salchicha y fiambres. ? Cortes grasos de carne de res y carne de ave con piel.  Cocine los alimentos en formas ms saludables, tales como hornear, emparrillar o asar a la parrilla.  Cuando vaya de compras, intente mantenerse fuera de los pasillos de la tienda. Esto lo ayudar a comprar una mayor cantidad de alimentos frescos y evitar los alimentos enlatados y preenvasados.  Qu cambios en el estilo de vida se pueden realizar?  Haga al menos 30minutos de ejercicio 5o ms das cada semana. Hacer ejercicio incluye caminar rpido, trabajar en el jardn, andar en bicicleta, correr, nadar y practicar deportes en equipo como baloncesto y ftbol. Consulte al mdico qu ejercicios son seguros para usted.  No consuma ningn producto que contenga nicotina o tabaco, como cigarrillos y cigarrillos electrnicos. Si necesita ayuda para dejar de fumar, consulte al mdico.  Limite el consumo de alcohol a no ms de 1medida por da si es mujer y no est embarazada, y 2medidas por da si es hombre. Una   medida equivale a 12oz (355ml) de cerveza, 5oz (148ml) de vino o 1oz (44ml) de bebidas alcohlicas de alta graduacin.  Trate de dormir entre 7 y 9horas todas las noches. Qu otros cambios se pueden realizar?  Lleve un registro de los alimentos que come y las actividades que realiza para controlar: ? Lo que comi  y cuntas caloras tena. Recuerde contar las salsas, los aderezos y las guarniciones. ? Si estuvo activo y qu ejercicios hizo. ? Sus objetivos de caloras, peso y actividad.  Controle su peso regularmente. Haga un seguimiento de los cambios. Si observa que ha subido de peso, haga cambios en su dieta o rutina de actividad.  Evite tomar medicamentos o suplementos para perder peso. Hable con el mdico antes de empezar cualquier medicamento o suplemento nuevo.  Hable con el mdico antes de probar una dieta nueva o un plan nuevo de actividad fsica. Por qu son importantes estos cambios? Comer sano, mantenerse activo y tener hbitos saludables no solo ayuda a prevenir la obesidad, sino tambin:  Le resultar ms sencillo controlar el estrs y las emociones.  Le resultar ms sencillo sentirse conectado con amigos y familiares.  Mejorar su autoestima.  Mejorar el sueo.  Prevendr problemas de salud a largo plazo.  Qu puede suceder si no se hacen cambios? Ser obeso o tener sobrepeso puede hacer que desarrolle problemas en las articulaciones o en los huesos, lo que puede dificultarle mantenerse activo o realizar actividades que disfruta. Ser obeso o tener sobrepeso hace que el corazn y los pulmones se esfuercen ms para trabajar, y puede provocar problemas de salud, como diabetes, enfermedad cardaca y ciertos tipos de cnceres. Dnde encontrar ms informacin: Hable con su mdico o un dietista sobre opciones de alimentacin saludable y estilo de vida saludable. Tambin puede encontrar ms informacin a travs de estos recursos:  MyPlate del Departamento de Agricultura de los [place]:www.choosemyplate.gov  Asociacin Estadounidense del [place] (American Heart Association): www.heart.org  Centers for Disease Control and Prevention (Centros para el control y la prevencin de enfermedades, CDC): www.cdc.gov  Resumen  Mantener un peso saludable es importante. Ayuda a prevenir  ciertas enfermedades y problemas de salud, como enfermedad cardaca, diabetes, trastornos del sueo, problemas articulares y ciertos tipos de cnceres.  Ser obeso o tener sobrepeso puede hacer que desarrolle problemas en las articulaciones o en los huesos, lo que puede dificultarle mantenerse activo o realizar actividades que disfruta.  Puede prevenir el aumento de peso no saludable al llevar una dieta saludable, hacer ejercicio regularmente, no fumar, limitar el consumo de alcohol y dormir lo suficiente.  Hable con su mdico o un dietista para obtener asesoramiento sobre opciones de alimentacin saludable y estilo de vida saludable. Esta informacin no tiene como fin reemplazar el consejo del mdico. Asegrese de hacerle al mdico cualquier pregunta que tenga. Document Released: 01/07/2017 Document Revised: 01/07/2017 Document Reviewed: 01/07/2017 Elsevier Interactive Patient Education  2018 Elsevier Inc.  

## 2017-10-18 NOTE — Progress Notes (Signed)
Subjective:  Patient ID: Kathleen Cline, female    DOB: [birth year]  Age: 37 y.o. MRN: [id number]  CC: annual exam  HPI  Kathleen Cline is a 37 y.o. female with a medical history of plantar fasciitis, fracture of right fibula, left/right ovarian cysts presents for an annual exam. No complaints besides her weight. Pt is obese and is trying a vegetable shake to reduce weight but is not working. Does not exercise because of the cold weather. Has never tried weight loss pills or had nutrition counseling.     Outpatient Medications Prior to Visit  Medication Sig Dispense Refill  . cyclobenzaprine (FLEXERIL) 10 MG tablet Take 1 tablet (10 mg total) by mouth 2 (two) times daily as needed for muscle spasms. 20 tablet 0  . ibuprofen (ADVIL,MOTRIN) 800 MG tablet Take 1 tablet (800 mg total) by mouth 3 (three) times daily. 21 tablet 0   No facility-administered medications prior to visit.      ROS Review of Systems  Constitutional: Negative for chills, fever and malaise/fatigue.  Eyes: Negative for blurred vision.  Respiratory: Negative for shortness of breath.   Cardiovascular: Negative for chest pain and palpitations.  Gastrointestinal: Negative for abdominal pain and nausea.  Genitourinary: Negative for dysuria and hematuria.  Musculoskeletal: Negative for joint pain and myalgias.  Skin: Negative for rash.  Neurological: Negative for tingling and headaches.  Psychiatric/Behavioral: Negative for depression. The patient is not nervous/anxious.     Objective:  BP 131/82 (BP Location: Left Arm, Patient Position: Sitting, Cuff Size: Large)   Pulse 93   Temp 97.9 F (36.6 C) (Oral)   Resp 18   Ht 5' (1.524 m)   Wt 245 lb (111.1 kg)   LMP 10/09/2017   SpO2 96%   BMI 47.85 kg/m   BP/Weight 10/18/2017 09/20/2017 1/44/3154  Systolic BP 008 676 195  Diastolic BP 82 86 62  Wt. (Lbs) 245 243 -  BMI 47.85 49.08 -      Physical Exam  Constitutional: She is oriented to person,  place, and time.  Well developed, obese, NAD, polite  HENT:  Head: Normocephalic and atraumatic.  Eyes: Conjunctivae and EOM are normal. Pupils are equal, round, and reactive to light. No scleral icterus.  Neck: Normal range of motion. Neck supple. No thyromegaly present.  Difficult to assess for thyromegaly or nodules due to body habitus  Cardiovascular: Normal rate, regular rhythm and normal heart sounds. Exam reveals no gallop and no friction rub.  No murmur heard. No LE edema bilaterally  Pulmonary/Chest: Effort normal and breath sounds normal. No respiratory distress. She has no wheezes.  Abdominal: Soft. Bowel sounds are normal. There is no tenderness.  Difficult to assess for abdominal mass due to body habitus   Musculoskeletal: Normal range of motion. She exhibits no edema or deformity.  Lymphadenopathy:    She has no cervical adenopathy.  Neurological: She is alert and oriented to person, place, and time. She has normal reflexes. No cranial nerve deficit. Coordination normal.  Skin: Skin is warm and dry. No rash noted. No erythema. No pallor.  Psychiatric: She has a normal mood and affect. Her behavior is normal. Thought content normal.  Vitals reviewed.    Assessment & Plan:    1. Annual physical exam - CBC with Differential; Future - Comprehensive metabolic panel; Future - TSH; Future - Lipid panel; Future  2. Need for prophylactic vaccination and inoculation against influenza - Flu Vaccine QUAD 6+ mos PF IM (Fluarix Quad  PF)  3. Screening for HIV (human immunodeficiency virus) - HIV antibody; Future  4. Class 3 severe obesity without serious comorbidity with body mass index (BMI) of 45.0 to 49.9 in adult, unspecified obesity type ([place]) - I have counseled patient on the importance of dieting and exercise. I have asked patient to annotate her daily meals and exercises to show me at the next visit. I have clearly stated I will not prescribe phentermine should she not  be willing to start dieting or exercising. Pt education on nutrition was printed and given to patient.      Follow-up: Return in about 4 weeks (around 11/15/2017) for obesity.   Clent Demark PA

## 2017-10-20 ENCOUNTER — Other Ambulatory Visit (INDEPENDENT_AMBULATORY_CARE_PROVIDER_SITE_OTHER): Payer: Self-pay

## 2017-10-20 DIAGNOSIS — Z114 Encounter for screening for human immunodeficiency virus [HIV]: Secondary | ICD-10-CM

## 2017-10-20 DIAGNOSIS — Z0000 Encounter for general adult medical examination without abnormal findings: Secondary | ICD-10-CM

## 2017-10-21 ENCOUNTER — Other Ambulatory Visit (INDEPENDENT_AMBULATORY_CARE_PROVIDER_SITE_OTHER): Payer: Self-pay | Admitting: Physician Assistant

## 2017-10-21 DIAGNOSIS — E7841 Elevated Lipoprotein(a): Secondary | ICD-10-CM | POA: Insufficient documentation

## 2017-10-21 LAB — COMPREHENSIVE METABOLIC PANEL
A/G RATIO: 1.4 (ref 1.2–2.2)
ALBUMIN: 4.5 g/dL (ref 3.5–5.5)
ALT: 33 IU/L — ABNORMAL HIGH (ref 0–32)
AST: 28 IU/L (ref 0–40)
Alkaline Phosphatase: 80 IU/L (ref 39–117)
BILIRUBIN TOTAL: 0.3 mg/dL (ref 0.0–1.2)
BUN / CREAT RATIO: 16 (ref 9–23)
BUN: 7 mg/dL (ref 6–20)
CHLORIDE: 104 mmol/L (ref 96–106)
CO2: 24 mmol/L (ref 20–29)
Calcium: 9 mg/dL (ref 8.7–10.2)
Creatinine, Ser: 0.45 mg/dL — ABNORMAL LOW (ref 0.57–1.00)
GFR calc Af Amer: 147 mL/min/{1.73_m2} (ref >59)
GFR calc non Af Amer: 128 mL/min/{1.73_m2} (ref >59)
Globulin, Total: 3.2 g/dL (ref 1.5–4.5)
Glucose: 104 mg/dL — ABNORMAL HIGH (ref 65–99)
POTASSIUM: 4.2 mmol/L (ref 3.5–5.2)
Sodium: 141 mmol/L (ref 134–144)
Total Protein: 7.7 g/dL (ref 6.0–8.5)

## 2017-10-21 LAB — CBC WITH DIFFERENTIAL/PLATELET
BASOS ABS: 0 10*3/uL (ref 0.0–0.2)
Basos: 0 %
EOS (ABSOLUTE): 0.1 10*3/uL (ref 0.0–0.4)
Eos: 1 %
HEMOGLOBIN: 12.9 g/dL (ref 11.1–15.9)
Hematocrit: 39.4 % (ref 34.0–46.6)
Immature Grans (Abs): 0 10*3/uL (ref 0.0–0.1)
Immature Granulocytes: 0 %
LYMPHS ABS: 2 10*3/uL (ref 0.7–3.1)
Lymphs: 31 %
MCH: 25.2 pg — AB (ref 26.6–33.0)
MCHC: 32.7 g/dL (ref 31.5–35.7)
MCV: 77 fL — AB (ref 79–97)
Monocytes Absolute: 0.4 10*3/uL (ref 0.1–0.9)
Monocytes: 5 %
NEUTROS ABS: 4.1 10*3/uL (ref 1.4–7.0)
Neutrophils: 63 %
Platelets: 186 10*3/uL (ref 150–379)
RBC: 5.12 x10E6/uL (ref 3.77–5.28)
RDW: 16.8 % — ABNORMAL HIGH (ref 12.3–15.4)
WBC: 6.5 10*3/uL (ref 3.4–10.8)

## 2017-10-21 LAB — LIPID PANEL
Chol/HDL Ratio: 4.3 ratio (ref 0.0–4.4)
Cholesterol, Total: 200 mg/dL — ABNORMAL HIGH (ref 100–199)
HDL: 47 mg/dL (ref >39)
LDL Calculated: 120 mg/dL — ABNORMAL HIGH (ref 0–99)
Triglycerides: 163 mg/dL — ABNORMAL HIGH (ref 0–149)
VLDL Cholesterol Cal: 33 mg/dL (ref 5–40)

## 2017-10-21 LAB — HIV ANTIBODY (ROUTINE TESTING W REFLEX): HIV Screen 4th Generation wRfx: NONREACTIVE

## 2017-10-21 LAB — TSH: TSH: 3.57 u[IU]/mL (ref 0.450–4.500)

## 2017-10-21 MED ORDER — LOVASTATIN 40 MG PO TABS
40.0000 mg | ORAL_TABLET | Freq: Every day | ORAL | 3 refills | Status: DC
Start: 2017-10-21 — End: 2017-11-18

## 2017-10-27 ENCOUNTER — Telehealth (INDEPENDENT_AMBULATORY_CARE_PROVIDER_SITE_OTHER): Payer: Self-pay | Admitting: *Deleted

## 2017-10-27 NOTE — Telephone Encounter (Signed)
Medical Assistant used [place] to contact patient.  Interpreter Name: [facility] Interpreter #: [phone] [place] states to give a call back to [place] with [facility] at [phone]. Patient is aware of cholesterol being elevated and [facility] 3 month supply totaling 10$.

## 2017-10-27 NOTE — Telephone Encounter (Signed)
-----   Message from Clent Demark, PA-C sent at 10/21/2017 12:17 PM EST ----- Cholesterol is elevated. Rest of labs normal/unremarkable.

## 2017-11-15 ENCOUNTER — Encounter (INDEPENDENT_AMBULATORY_CARE_PROVIDER_SITE_OTHER): Payer: Self-pay | Admitting: Physician Assistant

## 2017-11-15 ENCOUNTER — Ambulatory Visit (INDEPENDENT_AMBULATORY_CARE_PROVIDER_SITE_OTHER): Payer: Self-pay | Admitting: Physician Assistant

## 2017-11-15 VITALS — BP 139/84 | HR 90 | Temp 97.8°F | Wt 242.6 lb

## 2017-11-15 DIAGNOSIS — Z6841 Body Mass Index (BMI) 40.0 and over, adult: Secondary | ICD-10-CM

## 2017-11-15 MED ORDER — PHENTERMINE HCL 15 MG PO CAPS: 15.0000 mg | ORAL_CAPSULE | ORAL | 0 refills | Status: DC

## 2017-11-15 NOTE — Progress Notes (Signed)
Subjective:  Patient ID: Kathleen Cline, female    DOB: 07-20-1981  Age: 37 y.o. MRN: [id number]  CC: Obesity  HPI Kathleen Lindo Carmenis a 37 y.o.femalewith a medical history of HLD, plantar fasciitis, fracture of right fibula, left/right ovarian cysts presents on f/u of obesity. She was counseled on the importance of diet and exercise at the last visit one month ago. She was asked to bring a log of her diet and exercise but she did not annotate anything. Says she has been reducing the portion of her meals. Has been walking for 20-30 minutes three times per week. Walked perhaps 6-9 times over the last month. Last weighed 245 lbs one month ago. Weighs 242 lbs now. Does not endorse any cardiovascular symptoms.   Outpatient Medications Prior to Visit  Medication Sig Dispense Refill  . cyclobenzaprine (FLEXERIL) 10 MG tablet Take 1 tablet (10 mg total) by mouth 2 (two) times daily as needed for muscle spasms. (Patient not taking: Reported on 11/15/2017) 20 tablet 0  . ibuprofen (ADVIL,MOTRIN) 800 MG tablet Take 1 tablet (800 mg total) by mouth 3 (three) times daily. (Patient not taking: Reported on 11/15/2017) 21 tablet 0  . lovastatin (MEVACOR) 40 MG tablet Take 1 tablet (40 mg total) by mouth at bedtime. (Patient not taking: Reported on 11/15/2017) 90 tablet 3   No facility-administered medications prior to visit.      ROS Review of Systems  Constitutional: Negative for chills, fever and malaise/fatigue.  Eyes: Negative for blurred vision.  Respiratory: Negative for shortness of breath.   Cardiovascular: Negative for chest pain and palpitations.  Gastrointestinal: Negative for abdominal pain and nausea.  Genitourinary: Negative for dysuria and hematuria.  Musculoskeletal: Negative for joint pain and myalgias.  Skin: Negative for rash.  Neurological: Negative for tingling and headaches.  Psychiatric/Behavioral: Negative for depression. The patient is not nervous/anxious.     Objective:   BP 139/84 (BP Location: Left Arm, Patient Position: Sitting, Cuff Size: Large)   Pulse 90   Temp 97.8 F (36.6 C) (Oral)   Wt 242 lb 9.6 oz (110 kg)   LMP 11/15/2017 (Exact Date)   SpO2 97%   BMI 47.38 kg/m   BP/Weight 11/15/2017 11/17/6281 09/18/1759  Systolic BP 607 371 062  Diastolic BP 84 82 86  Wt. (Lbs) 242.6 245 243  BMI 47.38 47.85 49.08      Physical Exam  Constitutional: She is oriented to person, place, and time.  Well developed, obese, NAD, polite  HENT:  Head: Normocephalic and atraumatic.  Eyes: No scleral icterus.  Neck: Normal range of motion. Neck supple. No thyromegaly present.  Cardiovascular: Normal rate, regular rhythm and normal heart sounds.  Pulmonary/Chest: Effort normal and breath sounds normal.  Abdominal: Soft. Bowel sounds are normal. There is no tenderness.  Musculoskeletal: She exhibits no edema.  Neurological: She is alert and oriented to person, place, and time.  Skin: Skin is warm and dry. No rash noted. No erythema. No pallor.  Psychiatric: She has a normal mood and affect. Her behavior is normal. Thought content normal.  Vitals reviewed.    Assessment & Plan:    1. Class 3 severe obesity with serious comorbidity and body mass index (BMI) of 45.0 to 49.9 in adult, unspecified obesity type (HCC) - phentermine 15 MG capsule; Take 1 capsule (15 mg total) by mouth every morning.  Dispense: 30 capsule; Refill: 0   Meds ordered this encounter  Medications  . phentermine 15 MG capsule  Sig: Take 1 capsule (15 mg total) by mouth every morning.    Dispense:  30 capsule    Refill:  0    Order Specific Question:   Supervising Provider    Answer:   Tresa Garter [[id number]]    Follow-up: Return in about 4 weeks (around 12/13/2017) for obesity.   Clent Demark PA

## 2017-11-15 NOTE — Patient Instructions (Signed)
Phentermine tablets or capsules Qu es este medicamento? La FENTERMINA reduce su apetito. Si la combina con una dieta de bajas caloras y ejercio puede ayudarle a Sports coach de [place]. Este medicamento puede ser utilizado para otros usos; si tiene alguna pregunta consulte con su proveedor de atencin mdica o con su farmacutico. MARCAS COMUNES: Adipex-P, Atti-Plex P, Atti-Plex P Spansule, Fastin, Lomaira, Pro-Fast, Tara-8 [employer] debo informar a mi profesional de la salud antes de tomar este medicamento? Necesita saber si usted presenta alguno de los siguientes problemas o situaciones: -agitacin -glaucoma -enfermedad cardiaca -alta presin sangunea -antecedentes de abuso de substancias -enfermedad pulmonar llamada Hipertensin Pulmonar Primaria -si ha tomado un IMAO, tales como Carbex, Eldepryl, Marplan, Nardil o Parnate en los ltimos [place] reaccin alrgica o inusual a la fentermina, a otros medicamentos, alimentos, colorantes o conservantes -si est embarazada o buscando quedar embarazada -si est amamantando a un beb Cmo debo utilizar este medicamento? Tome este medicamento por va oral con un vaso de agua. Siga las instrucciones de la etiqueta del [place]. Las instrucciones de uso pueden variar segn el producto y la dosis que est tomando. Evite tomar este medicamento por la tarde antes de acostarse a dormir. Puede interferir con la conciliacin del sueo. Tome sus dosis a intervalos regulares. No tome su medicamento con una frecuencia mayor a la indicada. Hable con su pediatra para informarse acerca del uso de este medicamento en nios. Aunque este medicamento se puede recetar a nios a Proofreader de los 60 aos de edad en casos selectos, existen precauciones que deben cumplirse. Sobredosis: Pngase en contacto inmediatamente con un centro toxicolgico o una sala de urgencia si usted cree que haya tomado demasiado medicamento. ATENCIN: [employer] es  solo para usted. No comparta este medicamento con nadie. Qu sucede si me olvido de una dosis? Si olvida una dosis, tmela lo antes posible. Si es casi la hora de la prxima dosis, tome slo esa dosis. No tome dosis adicionales o dobles. Qu puede interactuar con este medicamento? No tome esta medicina con ninguno de los siguientes medicamentos: -duloxetina -IMAOs, tales como Carbex, Eldepryl, Marplan, Nardil y Parnate -medicamentos para resfros o problemas respiratorios, tales como seudoefedrina o fenilefrina -procarbazina -sibutramine -ISRS, tales como citalopram, escitalopram, fluoxetina, fluvoxamina, paroxetina y sertralina -estimulantes, tales como dexmetilfenidato, metilfenidato o modafinil -venlafaxina Esta medicina tambin puede interactuar con los siguientes medicamentos: -medicamentos para la diabetes Puede ser que esta lista no menciona todas las posibles interacciones. Informe a su profesional de [employer]	[place] de [employer] productos a base de hierbas, medicamentos de [place] o suplementos nutritivos que est tomando. Si usted fuma, consume bebidas alcohlicas o si utiliza drogas ilegales, indqueselo tambin a su profesional de [employer]	[place]. Algunas sustancias pueden interactuar con su medicamento. A qu debo estar atento al usar Coca[employer]Cola? Informe a su mdico de inmediato si siente que le falta el aliento mientras realiza sus actividades habituales. No tome este medicamento dentro de 6 horas de la hora de [place]. Puede impedirle dormir. Evite las bebidas que contienen cafena y trata de Theatre manager un horario de acostarse habitual cada noche. Este medicamento fue diseado para ser utilizado en combinacin con una dieta saludable y ejercicio. De esta manera se obtienen los [employer]. Este medicamento slo est indicado para uso a Control and instrumentation engineer. Con el tiempo, su peso puede estabilizarse. A partir de [employer], el medicamento slo le ayudar a Theatre manager su [employer]. No  aumente ni modifique su dosis de ninguna manera sin  consultar a su mdico. Puede experimentar mareos o somnolencia. No conduzca ni utilice maquinaria ni haga nada que Associate Professor en estado de alerta hasta que sepa cmo le afecta este medicamento. No se siente ni se ponga de pie con rapidez, especialmente si es un paciente de edad avanzada. Esto reduce el riesgo de mareos o [employer]. El alcohol puede aumentar los mareos y la somnolencia. Evite consumir bebidas alcohlicas. Qu efectos secundarios puedo tener al [employer] este medicamento? Efectos secundarios que debe informar a su mdico o a Barrister's clerk de la salud tan pronto como sea posible: -Tourist information centre manager, palpitaciones -depresin o cambios severos de estados de nimo -aumento de la presin sangunea -irritabilidad -nerviosismo o inquietud -mareos severos -falta de aliento -problemas para orinar -hinchazn inusual de las piernas -vmito Efectos secundarios que, por lo general, no requieren Geophysical data processor (debe informarlos a su mdico o a su profesional de la salud si persisten o si son molestos): -visin borrosa u otros problemas de ojo -cambios en la capacidad o el deseo sexual -estreimiento o diarrea -dificultad para conciliar el sueo -boca seca o sabor desagradable -dolor de cabeza -nuseas Puede ser que esta lista no menciona todos los posibles efectos secundarios. Comunquese a su mdico por asesoramiento mdico Humana Inc. Usted puede informar los efectos secundarios a la FDA por telfono al 1-800-FDA-1088. Dnde debo guardar mi medicina? Mantenga fuera del alcance de los nios. Existe la posibilidad de abusar de Coca-Cola. Mantenga su medicamento en un lugar seguro para protegerlo contra robos. No comparta este medicamento con nadie. Es [place], y est prohibido por la ley. Este medicamento puede causar Ardelia Mems sobredosis accidental y la muerte si lo toman  otros adultos, nios o Copy. Mezcle todo el medicamento sin usar con una sustancia como piedras sanitarias para gatos o posos (residuos) de caf. Luego deseche el [employer] un recipiente cerrado, como una bolsa sellada o una lata de caf con tapa. No utilice este medicamento despus de la fecha de vencimiento. Guarde a [employer], entre 20 y 85 grados Celsius (48 y 51 grados Fahrenheit). [place]. ATENCIN: Este folleto es un resumen. Puede ser que no cubra toda la posible informacin. Si usted tiene preguntas acerca de esta medicina, consulte con su mdico, su farmacutico o su profesional de Technical sales engineer.  2018 Elsevier/Gold Standard (2016-09-30 00:00:00)

## 2017-11-16 ENCOUNTER — Telehealth (INDEPENDENT_AMBULATORY_CARE_PROVIDER_SITE_OTHER): Payer: Self-pay | Admitting: Physician Assistant

## 2017-11-16 NOTE — Telephone Encounter (Signed)
Patient wants the Rx lovastatin (MEVACOR) 40 MG tablet  to be sent to [facility] . Please, let her know when is ready  Thank you

## 2017-11-16 NOTE — Telephone Encounter (Signed)
FWD to PCP. Alfredo Collymore S Konni Kesinger, CMA  

## 2017-11-18 ENCOUNTER — Other Ambulatory Visit (INDEPENDENT_AMBULATORY_CARE_PROVIDER_SITE_OTHER): Payer: Self-pay | Admitting: Physician Assistant

## 2017-11-18 DIAGNOSIS — E7841 Elevated Lipoprotein(a): Secondary | ICD-10-CM

## 2017-11-18 MED ORDER — LOVASTATIN 40 MG PO TABS: 40.0000 mg | ORAL_TABLET | Freq: Every day | ORAL | 3 refills | Status: DC

## 2017-11-18 NOTE — Telephone Encounter (Signed)
I sent Lovastatin to [facility]. Please notify.

## 2017-11-18 NOTE — Telephone Encounter (Signed)
Patient informed by bilingual front office staff. Nat Christen, CMA

## 2017-12-13 ENCOUNTER — Encounter (INDEPENDENT_AMBULATORY_CARE_PROVIDER_SITE_OTHER): Payer: Self-pay | Admitting: Physician Assistant

## 2017-12-13 ENCOUNTER — Ambulatory Visit (INDEPENDENT_AMBULATORY_CARE_PROVIDER_SITE_OTHER): Payer: Self-pay | Admitting: Physician Assistant

## 2017-12-13 VITALS — BP 137/84 | HR 89 | Temp 97.9°F | Ht 58.27 in | Wt 234.6 lb

## 2017-12-13 DIAGNOSIS — Z6841 Body Mass Index (BMI) 40.0 and over, adult: Secondary | ICD-10-CM | POA: Insufficient documentation

## 2017-12-13 MED ORDER — PHENTERMINE HCL 30 MG PO CAPS: 30.0000 mg | ORAL_CAPSULE | ORAL | 0 refills | Status: DC

## 2017-12-13 NOTE — Patient Instructions (Addendum)
Obesidad en los adultos  (Obesity, Adult)  La obesidad es un exceso de grasa corporal. Si usted tiene un ndice de masa corporal (IMC) de 30o ms, esto significa que es obeso. El IMC es un nmero que indica la cantidad de grasa corporal que tiene una persona. A menudo, la causa de la obesidad es el consumo de ms caloras que las que el cuerpo usa.  La obesidad puede causar problemas de salud graves. Cambiar el estilo de vida puede ayudar a tratar la obesidad.  CUIDADOS EN EL HOGAR  Comida y bebida   Siga las indicaciones del mdico respecto de las comidas y las bebidas. Su mdico puede recomendarle lo siguiente:  ? Reducir (limitar) las comidas rpidas, los dulces y las colaciones procesadas.  ? Elegir opciones con bajo contenido de grasas. Por ejemplo, leche descremada en lugar de leche entera.  ? Consumir 5o ms porciones de frutas o verduras por da.  ? Comer en casa con ms frecuencia. Esto le da ms control sobre lo que come.  ? Cuando coma afuera, elija alimentos saludables.  ? Aprenda cul es el tamao de una porcin saludable. El tamao de una porcin es la cantidad de un alimento determinado que se considera saludable consumir de una vez. Esto es diferente para cada persona.  ? Tenga a mano colaciones con bajo contenido de grasas.  ? Evite las bebidas azucaradas. Estas incluyen refrescos, jugo de frutas, t helado endulzado con azcar y leche saborizada.  ? Desayune de forma saludable.   Beba suficiente agua para mantener la orina clara o de color amarillo plido.   No est sin comer durante perodos prolongados (no ayune).   No haga dietas de moda (dietas pasajeras).  Actividad fsica   Haga ejercicios con frecuencia, como se lo haya indicado el mdico. Pregntele al mdico lo siguiente:  ? Los tipos de ejercicios que son seguros para usted.  ? La frecuencia con la que debe hacer los ejercicios.   Haga un precalentamiento y elongacin adecuados antes de la actividad.   Haga un estiramiento lento  despus de la actividad (relajacin).   Descanse entre los perodos de actividad.  Estilo de vida   Limite el tiempo que pasa frente a la televisin, la computadora o los videojuegos (sea menos sedentario).   Busque formas de recompensarse que no incluyan alimentos.   Limite el consumo de alcohol a no ms de 1 medida por da si es mujer y no est embarazada, y 2 medidas por da si es hombre. Una medida equivale a 12onzas de cerveza, 5onzas de vino o 1onzas de bebidas alcohlicas de alta graduacin.  Instrucciones generales   Lleve un diario de la prdida de peso. Esto puede ayudarlo a mantener un registro de lo siguiente:  ? Los alimentos que come.  ? Los ejercicios que hace.   Tome los medicamentos de venta libre y los recetados solamente como se lo haya indicado el mdico.   Tome vitaminas y suplementos solamente como se lo haya indicado el mdico.   Considere participar en un grupo de apoyo. Su mdico puede ayudarlo con esto.   Concurra a todas las visitas de control como se lo haya indicado el mdico. Esto es importante.  SOLICITE AYUDA SI:   No puede alcanzar su objetivo de prdida de peso despus de haber modificado su dieta y su estilo de vida durante 6semanas.  Esta informacin no tiene como fin reemplazar el consejo del mdico. Asegrese de hacerle al mdico cualquier pregunta que 

## 2017-12-13 NOTE — Progress Notes (Signed)
   Subjective:  Patient ID: Kathleen Cline, female    DOB: [birth year]  Age: 37 y.o. MRN: [id number]  CC:   HPI Kathleen Cline a 37 y.o.femalewith a medical history of HLD, plantar fasciitis, fracture of right fibula, left/right ovarian cystspresents on f/u of obesity. Has lost 8 lbs since last visit. 242 lbs to 234 lbs. This is    Has been exercising 3-4 times per week by walking slowly for 20 -25 minutes.   Does not endorse CP, palpitations, SOB, HA, abdominal pain,   Outpatient Medications Prior to Visit  Medication Sig Dispense Refill  . lovastatin (MEVACOR) 40 MG tablet Take 1 tablet (40 mg total) by mouth at bedtime. 90 tablet 3  . phentermine 15 MG capsule Take 1 capsule (15 mg total) by mouth every morning. 30 capsule 0   No facility-administered medications prior to visit.      ROS ROS  Objective:  LMP 11/15/2017 (Exact Date)   BP/Weight 11/15/2017 5/0/5697 05/18/8015  Systolic BP 553 748 270  Diastolic BP 84 82 86  Wt. (Lbs) 242.6 245 243  BMI 47.38 47.85 49.08      Physical Exam   Assessment & Plan:    1. Class 3 severe obesity without serious comorbidity with body mass index (BMI) of 45.0 to 49.9 in adult, unspecified obesity type (HCC) - Increase phentermine 30 MG capsule; Take 1 capsule (30 mg total) by mouth every morning.  Dispense: 30 capsule; Refill: 0   Meds ordered this encounter  Medications  . phentermine 30 MG capsule    Sig: Take 1 capsule (30 mg total) by mouth every morning.    Dispense:  30 capsule    Refill:  0    Order Specific Question:   Supervising Provider    Answer:   Tresa Garter [[id number]]    Follow-up: Return in about 8 weeks (around 02/07/2018), or if symptoms worsen or fail to improve.   Clent Demark PA

## 2018-01-20 ENCOUNTER — Ambulatory Visit: Payer: Self-pay | Attending: Family Medicine

## 2018-02-13 ENCOUNTER — Ambulatory Visit (INDEPENDENT_AMBULATORY_CARE_PROVIDER_SITE_OTHER): Payer: Self-pay | Admitting: Physician Assistant

## 2018-02-13 ENCOUNTER — Encounter (INDEPENDENT_AMBULATORY_CARE_PROVIDER_SITE_OTHER): Payer: Self-pay | Admitting: Physician Assistant

## 2018-02-13 VITALS — BP 132/83 | HR 95 | Temp 98.1°F | Ht 60.0 in | Wt 232.0 lb

## 2018-02-13 DIAGNOSIS — N83202 Unspecified ovarian cyst, left side: Secondary | ICD-10-CM

## 2018-02-13 DIAGNOSIS — Z6841 Body Mass Index (BMI) 40.0 and over, adult: Secondary | ICD-10-CM

## 2018-02-13 MED ORDER — PHENTERMINE HCL 30 MG PO CAPS: 30.0000 mg | ORAL_CAPSULE | ORAL | 0 refills | Status: DC

## 2018-02-13 NOTE — Patient Instructions (Signed)
Quiste ovrico  Ovarian Cyst  Un quiste ovrico es una bolsa llena de lquido que se forma en el ovario. Los ovarios son rganos de las mujeres que producen vulos. La mayora de los quistes ovricos desaparecen por s solos y no son cancerosos (son benignos). Otros quistes necesitan tratamiento.  Siga estas indicaciones en su casa:   Tome los medicamentos de venta libre y los recetados solamente como se lo haya indicado el mdico.   No conduzca ni use maquinaria pesada mientras toma analgsicos recetados.   Realcese exmenes plvicos y pruebas de Papanicolaou con la frecuencia que le haya indicado el mdico.   Retome sus actividades habituales como se lo haya indicado el mdico. Pregntele al mdico qu actividades son seguras para usted.   No consuma ningn producto que contenga nicotina o tabaco, como cigarrillos y cigarrillos electrnicos. Si necesita ayuda para dejar de fumar, consulte al mdico.   Concurra a todas las visitas de control como se lo haya indicado el mdico. Esto es importante.  Comunquese con un mdico si:   Los perodos menstruales:  ? Se retrasan.  ? Son irregulares.  ? Son dolorosos.   Sus perodos cesan.   Tiene dolor plvico que no desaparece.   Siente presin en la vejiga.   Tiene dificultad para vaciar la vejiga cuando orina.   Siente dolor durante las relaciones sexuales.   Le aparece alguno de los siguientes sntomas en el abdomen:  ? Sensacin de tener el estmago lleno.  ? Presin.  ? Molestias.  ? Dolor que persiste.  ? Hinchazn.   Se siente mal constantemente.   Tiene dificultades para defecar (estreimiento).   No tiene el apetito habitual (pierde el apetito).   Tiene un acn muy grave.   Nota un aumento del vello corporal y facial.   Aumenta o disminuye de peso sin hacer modificaciones en su actividad fsica y en su dieta habitual.   Cree que puede estar embarazada.  Solicite ayuda de inmediato si:   Siente en el abdomen un dolor muy intenso o que  empeora.   No puede comer ni beber sin vomitar.   Sbitamente tiene fiebre.   Los perodos menstruales son ms abundantes de lo habitual.  Esta informacin no tiene como fin reemplazar el consejo del mdico. Asegrese de hacerle al mdico cualquier pregunta que tenga.  Document Released: 06/20/2013 Document Revised: 12/04/2016 Document Reviewed: 02/01/2016  [employer]vier Interactive Patient Education  2018 [employer]

## 2018-02-13 NOTE — Progress Notes (Signed)
Subjective:  Patient ID: Kathleen Cline, female    DOB: [birth year]  Age: 37 y.o. MRN: [id number]  CC: f/u  HPI Kathleen Cline is a 37 y.o. female with a medical history of obesity presents on f/u for obesity. Last weight 234 lbs two months ago. Weighs 232 lbs today. Took Phentermine for two months in total. She is reducing portions of food and eating less carbohydrates, drinking only water, and walking 2-3 times per day. Does not endorse CP, palpitations, SOB, HA, abdominal pain, f/c/n/v, rash, or GI/GU sxs.     Would like to know about f/u on left ovarian cyst. Last US pelvic/transvaginal 02/21/17 which revealed a 4 cm cyst on the left ovary.     Outpatient Medications Prior to Visit  Medication Sig Dispense Refill  . lovastatin (MEVACOR) 40 MG tablet Take 1 tablet (40 mg total) by mouth at bedtime. 90 tablet 3  . phentermine 30 MG capsule Take 1 capsule (30 mg total) by mouth every morning. (Patient not taking: Reported on 02/13/2018) 30 capsule 0   No facility-administered medications prior to visit.      ROS Review of Systems  Constitutional: Negative for chills, fever and malaise/fatigue.  Eyes: Negative for blurred vision.  Respiratory: Negative for shortness of breath.   Cardiovascular: Negative for chest pain and palpitations.  Gastrointestinal: Negative for abdominal pain and nausea.  Genitourinary: Negative for dysuria and hematuria.  Musculoskeletal: Negative for joint pain and myalgias.  Skin: Negative for rash.  Neurological: Negative for tingling and headaches.  Psychiatric/Behavioral: Negative for depression. The patient is not nervous/anxious.     Objective:  BP 132/83 (BP Location: Left Arm, Patient Position: Sitting, Cuff Size: Large)   Pulse 95   Temp 98.1 F (36.7 C) (Oral)   Ht 5' (1.524 m)   Wt 232 lb (105.2 kg)   LMP 02/03/2018 (Exact Date)   SpO2 94%   BMI 45.31 kg/m   BP/Weight 02/13/2018 1/74/0814 12/20/1854  Systolic BP 314 -  970  Diastolic BP 83 - 84  Wt. (Lbs) 232 227.4 234.6  BMI 45.31 44.41 48.58      Physical Exam  Constitutional: She is oriented to person, place, and time.  Well developed, obese, NAD, polite  HENT:  Head: Normocephalic and atraumatic.  Eyes: No scleral icterus.  Neck: Normal range of motion. Neck supple. No thyromegaly present.  Cardiovascular: Normal rate, regular rhythm and normal heart sounds.  Pulmonary/Chest: Effort normal and breath sounds normal.  Abdominal: Soft. Bowel sounds are normal. There is no tenderness.  Musculoskeletal: She exhibits no edema.  Neurological: She is alert and oriented to person, place, and time.  Skin: Skin is warm and dry. No rash noted. No erythema. No pallor.  Psychiatric: She has a normal mood and affect. Her behavior is normal. Thought content normal.  Vitals reviewed.    Assessment & Plan:    1. Left ovarian cyst - US Transvaginal Non-OB; Future - US Pelvis Complete; Future  2. Class 3 severe obesity without serious comorbidity with body mass index (BMI) of 45.0 to 49.9 in adult, unspecified obesity type (HCC) - Refill phentermine 30 MG capsule; Take 1 capsule (30 mg total) by mouth every morning.  Dispense: 30 capsule; Refill: 0 - Advised pt to include higher intensity exercises such as swimming, Zumba, cycling, etc.    Meds ordered this encounter  Medications  . phentermine 30 MG capsule    Sig: Take 1 capsule (30 mg total) by mouth  every morning.    Dispense:  30 capsule    Refill:  0    Order Specific Question:   Supervising Provider    Answer:   Charlott Rakes [[id number]]    Follow-up: one month for obesity.   Clent Demark PA

## 2018-02-15 ENCOUNTER — Ambulatory Visit (HOSPITAL_COMMUNITY)
Admission: RE | Admit: 2018-02-15 | Discharge: 2018-02-15 | Disposition: A | Discharge status: Home / Self Care | Payer: Self-pay | Source: Ambulatory Visit | Attending: Physician Assistant | Admitting: Physician Assistant

## 2018-02-15 DIAGNOSIS — N83201 Unspecified ovarian cyst, right side: Secondary | ICD-10-CM | POA: Insufficient documentation

## 2018-02-15 DIAGNOSIS — N83202 Unspecified ovarian cyst, left side: Secondary | ICD-10-CM | POA: Insufficient documentation

## 2018-02-16 ENCOUNTER — Other Ambulatory Visit (INDEPENDENT_AMBULATORY_CARE_PROVIDER_SITE_OTHER): Payer: Self-pay | Admitting: Physician Assistant

## 2018-02-16 DIAGNOSIS — N83201 Unspecified ovarian cyst, right side: Secondary | ICD-10-CM

## 2018-02-16 DIAGNOSIS — N83202 Unspecified ovarian cyst, left side: Principal | ICD-10-CM

## 2018-02-24 ENCOUNTER — Telehealth (INDEPENDENT_AMBULATORY_CARE_PROVIDER_SITE_OTHER): Payer: Self-pay

## 2018-02-24 NOTE — Telephone Encounter (Signed)
Patient is aware of Right ovary with large cyst that appears similar to previous imaging. Will need MRI due to size. Left ovarian cyst same size as previous, appears benign, and uterine fibroid of 2.1 cm in size. I have ordered the MRI, please call radiology. Called and scheduled MRI while on phone with patient. MRI is 03/01/18 arrive by 4:30 at [facility] Radiology appointment. Patient repeated appointment information to ensure accuracy. Nat Christen, CMA   Call made to patient using pacific interpreter [phone])

## 2018-02-24 NOTE — Telephone Encounter (Signed)
-----   Message from Clent Demark, PA-C sent at 02/16/2018  8:41 AM EDT ----- Right ovary with large cyst that appears similar to previous imaging. Will need MRI due to size. Left ovarian cyst same size as previous, appears benign, and uterine fibroid of 2.1 cm in size. I have ordered the MRI, please call radiology.

## 2018-03-01 ENCOUNTER — Ambulatory Visit (HOSPITAL_COMMUNITY): Admission: RE | Admit: 2018-03-01 | Payer: Self-pay | Source: Ambulatory Visit

## 2018-03-13 ENCOUNTER — Ambulatory Visit (INDEPENDENT_AMBULATORY_CARE_PROVIDER_SITE_OTHER): Payer: Self-pay | Admitting: Physician Assistant

## 2018-03-13 ENCOUNTER — Encounter (INDEPENDENT_AMBULATORY_CARE_PROVIDER_SITE_OTHER): Payer: Self-pay | Admitting: Physician Assistant

## 2018-03-13 VITALS — BP 134/85 | HR 99 | Temp 98.5°F | Resp 18 | Wt 222.0 lb

## 2018-03-13 DIAGNOSIS — N949 Unspecified condition associated with female genital organs and menstrual cycle: Secondary | ICD-10-CM

## 2018-03-13 DIAGNOSIS — R102 Pelvic and perineal pain: Secondary | ICD-10-CM

## 2018-03-13 DIAGNOSIS — Z6841 Body Mass Index (BMI) 40.0 and over, adult: Secondary | ICD-10-CM

## 2018-03-13 MED ORDER — TOPIRAMATE 50 MG PO TABS: ORAL_TABLET | ORAL | 5 refills | Status: DC

## 2018-03-13 NOTE — Patient Instructions (Signed)
Prevenir el aumento de peso no saludable, en adultos Preventing Unhealthy Weight Gain, Adult Mantener un peso saludable es importante. Cuando la grasa se acumula en el cuerpo, usted puede convertirse en obeso o tener sobrepeso. Estos problemas hacen que tenga un mayor riesgo de desarrollar ciertos problemas de salud, como enfermedad cardaca, diabetes, problemas para dormir, problemas articulares y ciertos tipos de cnceres. El aumento de peso no saludable suele ser es el resultado de tomar decisiones poco saludables en lo que respecta a lo que come. Tambin es el resultado de no hacer suficiente ejercicio fsico. Puede hacer cambios en su estilo de vida para prevenir la obesidad y mantenerse tan saludable como sea posible. Qu cambios nutricionales se pueden realizar? Para mantener un peso saludable y prevenir la obesidad:  Coma solo la cantidad que el cuerpo necesita. Haga lo siguiente: ? Preste atencin a los signos de que tiene hambre o que se siente satisfecho. Deje de comer tan pronto como se sienta satisfecho. ? Si tiene hambre, primero beba agua. Beba suficiente agua para que la orina sea clara o de color amarillo plido. ? Coma porciones ms pequeas. ? Consulte los tamaos de las porciones en las etiquetas de los alimentos. La mayora de los alimentos contiene ms de una porcin por envase. ? Consuma la cantidad recomendada de caloras segn su sexo y [place]. Si bien la [employer] de las personas activas deben consumir alrededor de 2000 caloras por da, si ests tratando de perder peso o si no es muy [place], debe consumir menos caloras. Hable con el mdico o el nutricionista sobre cuntas caloras debe consumir por Training and development officer.  Elija alimentos saludables, por ejemplo: ? Frutas y verduras. Trate de que al [employer] mitad del plato de cada comida sea de frutas y verduras. ? Cereales integrales, como el pan integral, el arroz integral y Fish farm manager. ? Carnes magras, como la carne de ave o  pescado. ? Otras protenas saludables, como los frijoles, los huevos o el tofu. ? Grasas saludables, como las nueces, las semillas, los pescados grasos y el aceite de [place]. ? Productos lcteos con bajo contenido de [place] o sin grasa.  Revise las etiquetas de los alimentos y evite los alimentos y bebidas que: ? Contienen muchas caloras. ? Tienen agregados de Location manager. ? Contienen [employer]. ? Tienen grasas saturadas o grasas trans.  Limite la cantidad que consume de los siguientes alimentos: ? Comidas envasadas. ? Comida rpida. ? Comidas fritas. ? Carnes procesadas, como tocino, salchicha y fiambres. ? Cortes grasos de carne de res y carne de ave con piel.  Cocine los alimentos en formas ms saludables, tales como hornear, Marine scientist o asar a la parrilla.  Cuando vaya de compras, intente mantenerse fuera de los pasillos de la tienda. Esto lo ayudar a comprar una mayor cantidad de alimentos frescos y Product/process development scientist los alimentos enlatados y preenvasados.  Qu cambios en el estilo de vida se pueden realizar?  Haga al menos 19minutos de ejercicio 5o ms [employer]. Hacer ejercicio incluye caminar rpido, trabajar en el jardn, andar en bicicleta, correr, nadar y practicar deportes en equipo como baloncesto y ftbol. Consulte al mdico qu ejercicios son seguros para usted.  No consuma ningn producto que contenga nicotina o tabaco, como cigarrillos y Psychologist, sport and exercise. Si necesita ayuda para dejar de fumar, consulte al mdico.  Limite el consumo de alcohol a no ms de 59medida por da si es mujer y no est [place], y 2medidas por da si es hombre. Ardelia Mems  medida equivale a 12oz (339ml) de cerveza, 5oz (156ml) de vino o 1oz (28ml) de bebidas alcohlicas de alta graduacin.  Trate de dormir entre 7 y [place] noches. Qu otros cambios se pueden realizar?  Lleve un registro de los [employer] come y las actividades que realiza para Chief Technology Officer: ? Lo que comi  y cuntas caloras tena. Recuerde [employer], los aderezos y las guarniciones. ? Si estuvo activo y qu ejercicios hizo. ? Sus objetivos de [place], [place] y [place].  Controle su peso regularmente. Haga un seguimiento de los [place]. Si observa que ha subido de [place], haga cambios en su dieta o [place].  Evite tomar medicamentos o suplementos para perder peso. Hable con el mdico antes de empezar cualquier medicamento o suplemento nuevo.  Hable con el mdico antes de probar una dieta nueva o un plan nuevo de actividad fsica. Por qu son importantes estos cambios? Comer sano, mantenerse activo y tener hbitos saludables no solo ayuda a prevenir la obesidad, sino tambin:  Le resultar ms sencillo controlar el estrs y las emociones.  Le resultar ms sencillo sentirse conectado con amigos y familiares.  Mejorar su autoestima.  Mejorar el sueo.  Prevendr problemas de salud a Barrister's clerk.  Qu puede suceder si no se hacen cambios? Ser obeso o tener sobrepeso puede hacer que desarrolle problemas en las articulaciones o en los [place], lo que puede dificultarle mantenerse activo o Optometrist actividades que disfruta. Ser obeso o tener sobrepeso hace que el corazn y los pulmones se esfuercen ms para Fish farm manager, y [employer] de salud, como diabetes, enfermedad cardaca y ciertos tipos de cnceres. Dnde encontrar ms informacin: Hable con su mdico o un dietista sobre opciones de alimentacin saludable y estilo de vida saludable. Tambin puede encontrar ms informacin a travs de estos recursos:  MyPlate del Departamento de Armed forces logistics/support/administrative officer de los Estados PyroWorkshop.hu  Asociacin Estadounidense del [place] (American Heart Association): www.heart.org  Centers for Disease Control and Prevention (Centros para el control y la prevencin de enfermedades, CDC): http://www.wolf.info/  Resumen  Mantener un peso saludable es importante. Ayuda a prevenir  ciertas enfermedades y problemas de salud, como enfermedad cardaca, diabetes, trastornos del sueo, problemas articulares y ciertos tipos de cnceres.  Ser obeso o tener sobrepeso puede hacer que desarrolle problemas en las articulaciones o en los [place], lo que puede dificultarle mantenerse activo o Optometrist actividades que disfruta.  Puede prevenir el aumento de peso no saludable al llevar una dieta saludable, hacer ejercicio regularmente, no fumar, limitar el consumo de alcohol y dormir lo suficiente.  Hable con su mdico o un dietista para obtener asesoramiento sobre opciones de alimentacin saludable y estilo de vida saludable. Esta informacin no tiene Marine scientist el consejo del mdico. Asegrese de hacerle al mdico cualquier pregunta que tenga. Document Released: 01/07/2017 Document Revised: 01/07/2017 Document Reviewed: 01/07/2017 Elsevier Interactive Patient Education  [employer].

## 2018-03-13 NOTE — Progress Notes (Signed)
Subjective:  Patient ID: Kathleen Cline, female    DOB: October 08, 1980  Age: 37 y.o. MRN: [id number]  CC: f/u obesity   HPI Kathleen Cline is a 37 y.o. female with a medical history of obesity presents on f/u for obesity. Last weighed 232 lbs nearly one month ago. Weighs 222 lbs today. Took Phentermine for three months in total. Total weight loss over three months is twenty lbs. She is reducing portions of food and eating less carbohydrates, drinking only water, and walking 2-3 times per day. Does not endorse CP, palpitations, SOB, HA, abdominal pain, f/c/n/v, rash, or GI/GU sxs.     Pt also inquires about ovarian cysts. Recent US shows a 9.1 cm adnexal cyst on right side for which an MRI was recommended. Pt reports scheduled appointment for MRI on March 31, 2018. Right pelvic region has been tender/bothersome. Trying to become pregnant. Does not endorse any other symptoms or complaints.    Outpatient Medications Prior to Visit  Medication Sig Dispense Refill  . lovastatin (MEVACOR) 40 MG tablet Take 1 tablet (40 mg total) by mouth at bedtime. 90 tablet 3  . phentermine 30 MG capsule Take 1 capsule (30 mg total) by mouth every morning. 30 capsule 0   No facility-administered medications prior to visit.      ROS Review of Systems  Constitutional: Negative for chills, fever and malaise/fatigue.  Eyes: Negative for blurred vision.  Respiratory: Negative for shortness of breath.   Cardiovascular: Negative for chest pain and palpitations.  Gastrointestinal: Negative for abdominal pain and nausea.  Genitourinary: Negative for dysuria and hematuria.       Right pelvic discomfort   Musculoskeletal: Negative for joint pain and myalgias.  Skin: Negative for rash.  Neurological: Negative for tingling and headaches.  Psychiatric/Behavioral: Negative for depression. The patient is not nervous/anxious.     Objective:  BP 134/85 (BP Location: Left Arm, Patient Position:  Sitting, Cuff Size: Large)   Pulse 99   Temp 98.5 F (36.9 C) (Oral)   Resp 18   Wt 222 lb (100.7 kg)   LMP 03/08/2018   SpO2 96%   BMI 43.36 kg/m   BP/Weight 03/13/2018 02/13/2018 03/12/5283  Systolic BP 132 440 -  Diastolic BP 85 83 -  Wt. (Lbs) 222 232 227.4  BMI 43.36 45.31 44.41      Physical Exam  Constitutional: She is oriented to person, place, and time.  Well developed, obese, NAD, polite  HENT:  Head: Normocephalic and atraumatic.  Eyes: No scleral icterus.  Neck: Normal range of motion. Neck supple. No thyromegaly present.  Cardiovascular: Normal rate, regular rhythm and normal heart sounds.  Pulmonary/Chest: Effort normal and breath sounds normal.  Abdominal: Soft. Bowel sounds are normal. There is no tenderness.  Musculoskeletal: She exhibits no edema.  Neurological: She is alert and oriented to person, place, and time.  Skin: Skin is warm and dry. No rash noted. No erythema. No pallor.  Psychiatric: She has a normal mood and affect. Her behavior is normal. Thought content normal.  Vitals reviewed.    Assessment & Plan:    1. Class 3 severe obesity without serious comorbidity with body mass index (BMI) of 40.0 to 44.9 in adult, unspecified obesity type (HCC) - Lost twenty lbs over three months with phentermine.  - Begin Topiramate 50 mg. Take as directed.  - Referral to Nutrition and Diabetes Services  2. Adnexal cyst - 9.1 cm right adnexa, to have MRI on 03/31/18  for further characterization.   Meds ordered this encounter  Medications  . topiramate (TOPAMAX) 50 MG tablet    Sig: Take half tablet for one week, then take one whole tablet before bedtime every day.    Dispense:  30 tablet    Refill:  5    Order Specific Question:   Supervising Provider    Answer:   Charlott Rakes [[id number]]    Follow-up: Return in about 3 months (around 06/13/2018) for Obesity.   Clent Demark PA

## 2018-03-14 LAB — HCG, SERUM, QUALITATIVE: hCG,Beta Subunit,Qual,Serum: NEGATIVE m[IU]/mL (ref <6)

## 2018-03-20 ENCOUNTER — Telehealth: Payer: Self-pay | Admitting: *Deleted

## 2018-03-20 NOTE — Telephone Encounter (Signed)
Medical Assistant used [employer] to contact patient.  Interpreter Name: Kennith [place] Interpreter #: [id number] Patient verified DOB Patient is aware of pregnancy being negative.

## 2018-03-20 NOTE — Telephone Encounter (Signed)
-----   Message from Clent Demark, PA-C sent at 03/14/2018 11:55 AM EDT ----- Negative pregnancy.

## 2018-03-21 ENCOUNTER — Ambulatory Visit (HOSPITAL_COMMUNITY)
Admission: RE | Admit: 2018-03-21 | Discharge: 2018-03-21 | Disposition: A | Discharge status: Home / Self Care | Payer: Self-pay | Source: Ambulatory Visit | Attending: Physician Assistant | Admitting: Physician Assistant

## 2018-03-21 DIAGNOSIS — N83201 Unspecified ovarian cyst, right side: Secondary | ICD-10-CM | POA: Insufficient documentation

## 2018-03-21 DIAGNOSIS — N83202 Unspecified ovarian cyst, left side: Secondary | ICD-10-CM

## 2018-03-21 MED ORDER — GADOBENATE DIMEGLUMINE 529 MG/ML IV SOLN
20.0000 mL | Freq: Once | INTRAVENOUS | Status: AC | PRN
  Administered 2018-03-21: 20 mL via INTRAVENOUS

## 2018-03-22 ENCOUNTER — Ambulatory Visit: Payer: Self-pay | Attending: Physician Assistant

## 2018-03-23 ENCOUNTER — Other Ambulatory Visit (INDEPENDENT_AMBULATORY_CARE_PROVIDER_SITE_OTHER): Payer: Self-pay | Admitting: Physician Assistant

## 2018-03-23 DIAGNOSIS — N83201 Unspecified ovarian cyst, right side: Secondary | ICD-10-CM

## 2018-03-24 ENCOUNTER — Telehealth (INDEPENDENT_AMBULATORY_CARE_PROVIDER_SITE_OTHER): Payer: Self-pay

## 2018-03-24 NOTE — Telephone Encounter (Signed)
Call placed using pacific interpreter [place]. Patient aware of large cyst measuring 7.4x9.7x9.7. Referral placed to gyn someone from GYN office will call to schedule appointment. Nat Christen, CMA

## 2018-03-24 NOTE — Telephone Encounter (Signed)
-----   Message from Clent Demark, PA-C sent at 03/23/2018  8:36 AM EDT ----- Has a large cyst which measures 7.4 x 9.7 x 9.7 cm. May consider resection. I am sending referral to GYN.

## 2018-04-11 ENCOUNTER — Encounter: Payer: Self-pay | Attending: Physician Assistant | Admitting: Dietician

## 2018-04-11 ENCOUNTER — Encounter: Payer: Self-pay | Admitting: Dietician

## 2018-04-11 DIAGNOSIS — Z6841 Body Mass Index (BMI) 40.0 and over, adult: Secondary | ICD-10-CM

## 2018-04-11 DIAGNOSIS — Z713 Dietary counseling and surveillance: Secondary | ICD-10-CM | POA: Insufficient documentation
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
## 2018-04-11 NOTE — Patient Instructions (Addendum)
Continue the great changes that you have made.   Eat slowly and stop when you are satisfied.  Light dinner   Avoiding fried foods and choosing lean meat.   Drinking water rather than soda.   Consider walking every day- especially on your days off.

## 2018-05-17 ENCOUNTER — Encounter: Payer: Self-pay | Admitting: Obstetrics & Gynecology

## 2018-05-23 ENCOUNTER — Other Ambulatory Visit (INDEPENDENT_AMBULATORY_CARE_PROVIDER_SITE_OTHER): Payer: Self-pay | Admitting: Physician Assistant

## 2018-05-23 DIAGNOSIS — D2121 Benign neoplasm of connective and other soft tissue of right lower limb, including hip: Secondary | ICD-10-CM

## 2018-05-29 ENCOUNTER — Encounter: Payer: Self-pay | Admitting: Family Medicine

## 2018-05-29 ENCOUNTER — Ambulatory Visit (INDEPENDENT_AMBULATORY_CARE_PROVIDER_SITE_OTHER): Payer: Self-pay | Admitting: Family Medicine

## 2018-05-29 VITALS — BP 130/70 | HR 83 | Ht 60.0 in | Wt 227.5 lb

## 2018-05-29 DIAGNOSIS — Z124 Encounter for screening for malignant neoplasm of cervix: Secondary | ICD-10-CM

## 2018-05-29 DIAGNOSIS — Z1151 Encounter for screening for human papillomavirus (HPV): Secondary | ICD-10-CM

## 2018-05-29 DIAGNOSIS — N83201 Unspecified ovarian cyst, right side: Secondary | ICD-10-CM

## 2018-05-29 DIAGNOSIS — Z113 Encounter for screening for infections with a predominantly sexual mode of transmission: Secondary | ICD-10-CM

## 2018-05-29 NOTE — Progress Notes (Signed)
   Subjective:    Patient ID: Kathleen Cline, female    DOB: 1980-12-08, 37 y.o.   MRN: [id number]  HPI  Patient seen for right ovarian cyst. Was seen by PCP in June 2019 for pelvic pain and was found to have a large right ovarian cyst. MRI was recommended, which showed a 10x10x7cm simple ovarian cyst. She has a history of ovarian cysts - was seen here for the same concern, but had resolution of her ovarian cyst.  She is wanting to become pregnant.  Review of Systems  Constitutional: Negative for chills, diaphoresis, fatigue and fever.  Genitourinary: Negative for vaginal bleeding, vaginal discharge and vaginal pain.      Objective:   Physical Exam  HENT:  Head: Normocephalic and atraumatic.  Cardiovascular: Normal rate and regular rhythm.  Pulmonary/Chest: Effort normal and breath sounds normal.  Abdominal: Soft. Bowel sounds are normal. She exhibits no distension and no mass. There is no tenderness. There is no guarding. Hernia confirmed negative in the right inguinal area and confirmed negative in the left inguinal area.  Genitourinary: There is no rash, tenderness, lesion or injury on the right labia. There is no rash, tenderness, lesion or injury on the left labia. Cervix exhibits no motion tenderness, no discharge and no friability. Right adnexum displays tenderness. Left adnexum displays tenderness. No erythema, tenderness or bleeding in the vagina. No foreign body in the vagina. No signs of injury around the vagina. No vaginal discharge found.  Genitourinary Comments: Due to body habitus, unable to appreciate fullness or mass in adnexa.  Lymphadenopathy: No inguinal adenopathy noted on the right or left side.      Assessment & Plan:  1. Ovarian cyst, right Discussed prevention of cyst with OCPs, however patient would like to become pregnant. Will repeat US and make plan. - US PELVIS TRANSVANGINAL NON-OB (TV ONLY); Future  2. Cervical cancer screening - Cytology -  PAP

## 2018-06-01 LAB — CYTOLOGY - PAP
Chlamydia: NEGATIVE
DIAGNOSIS: NEGATIVE
HPV (WINDOPATH): NOT DETECTED
NEISSERIA GONORRHEA: NEGATIVE
TRICH (WINDOWPATH): NEGATIVE

## 2018-06-02 ENCOUNTER — Ambulatory Visit (HOSPITAL_COMMUNITY)
Admission: RE | Admit: 2018-06-02 | Discharge: 2018-06-02 | Disposition: A | Discharge status: Home / Self Care | Payer: Self-pay | Source: Ambulatory Visit | Attending: Family Medicine | Admitting: Family Medicine

## 2018-06-02 DIAGNOSIS — N83201 Unspecified ovarian cyst, right side: Secondary | ICD-10-CM

## 2018-06-02 DIAGNOSIS — N83291 Other ovarian cyst, right side: Secondary | ICD-10-CM | POA: Insufficient documentation

## 2018-06-07 ENCOUNTER — Other Ambulatory Visit: Payer: Self-pay | Admitting: Family Medicine

## 2018-06-07 DIAGNOSIS — N83201 Unspecified ovarian cyst, right side: Secondary | ICD-10-CM

## 2018-06-09 ENCOUNTER — Telehealth: Payer: Self-pay | Admitting: *Deleted

## 2018-06-09 NOTE — Telephone Encounter (Signed)
Called pt using interpreter ID [phone]  Called to inform pt of ultrasound results.  Informed pt that the doctor wanted a blood level done to check for her risk of cancer. Pt verbalized understanding.   Pt able to come Monday 9/30 at 1430.  Will schedule appointment with Dr. Rosana Hoes after she has her lab drawn.

## 2018-06-09 NOTE — Telephone Encounter (Signed)
-----   Message from Truett Mainland, DO sent at 06/07/2018 10:08 AM EDT ----- Cyst still present and relatively unchanged. Please call patient and have her come in and get a CA125 (order placed). Please schedule for appt with Dr Rosana Hoes, next available (a few days after CA125).

## 2018-06-12 ENCOUNTER — Other Ambulatory Visit: Payer: Self-pay

## 2018-06-12 DIAGNOSIS — N83201 Unspecified ovarian cyst, right side: Secondary | ICD-10-CM

## 2018-06-13 ENCOUNTER — Encounter (INDEPENDENT_AMBULATORY_CARE_PROVIDER_SITE_OTHER): Payer: Self-pay | Admitting: Physician Assistant

## 2018-06-13 ENCOUNTER — Ambulatory Visit (INDEPENDENT_AMBULATORY_CARE_PROVIDER_SITE_OTHER): Payer: Self-pay | Admitting: Physician Assistant

## 2018-06-13 VITALS — BP 131/71 | HR 81 | Temp 98.1°F | Ht 60.0 in | Wt 231.0 lb

## 2018-06-13 DIAGNOSIS — R102 Pelvic and perineal pain: Secondary | ICD-10-CM

## 2018-06-13 DIAGNOSIS — D2121 Benign neoplasm of connective and other soft tissue of right lower limb, including hip: Secondary | ICD-10-CM

## 2018-06-13 DIAGNOSIS — Z23 Encounter for immunization: Secondary | ICD-10-CM

## 2018-06-13 LAB — CA 125: Cancer Antigen (CA) 125: 7.4 U/mL (ref 0.0–38.1)

## 2018-06-13 NOTE — Progress Notes (Signed)
Pt complains of pain by ovaries

## 2018-06-13 NOTE — Progress Notes (Signed)
Subjective:  Patient ID: Kathleen Cline, female    DOB: 1981/09/11  Age: 37 y.o. MRN: [id number]  CC: f/u obesity  HPI [name] a 37 y.o.femalewith a medical history of obesity presents on f/u for obesity. Last seen here three months ago. She had lost a total of 20 lbs over three months with phentermine. Pt is currently focusing on treatment for large right ovarian cyst. Recent US shows a 9.3 cm simple right ovarian cystic lesion without overt malignant features that is grossly unchanged when compared to recent MRI done on 03/22/18. Pt has also had CA125 ordered by GYN which was within normal limits. Has a f/u appt with GYN to discuss possible resection of cyst. Right pelvic region has been tender/bothersome. Currently trying to conceive. Does not endorse any other symptoms or complaints.     Outpatient Medications Prior to Visit  Medication Sig Dispense Refill  . lovastatin (MEVACOR) 40 MG tablet Take 1 tablet (40 mg total) by mouth at bedtime. 90 tablet 3  . phentermine 30 MG capsule Take 1 capsule (30 mg total) by mouth every morning. (Patient not taking: Reported on 05/29/2018) 30 capsule 0  . topiramate (TOPAMAX) 50 MG tablet Take half tablet for one week, then take one whole tablet before bedtime every day. (Patient not taking: Reported on 04/11/2018) 30 tablet 5   No facility-administered medications prior to visit.      ROS Review of Systems  Constitutional: Negative for chills, fever and malaise/fatigue.  Eyes: Negative for blurred vision.  Respiratory: Negative for shortness of breath.   Cardiovascular: Negative for chest pain and palpitations.  Gastrointestinal: Negative for abdominal pain and nausea.  Genitourinary: Negative for dysuria and hematuria.       Right pelvic pain/discomfort  Musculoskeletal: Negative for joint pain and myalgias.  Skin: Negative for rash.  Neurological: Negative for tingling and headaches.   Psychiatric/Behavioral: Negative for depression. The patient is not nervous/anxious.     Objective:  BP 131/71 (BP Location: Left Arm, Patient Position: Sitting, Cuff Size: Large)   Pulse 81   Temp 98.1 F (36.7 C) (Oral)   Ht 5' (1.524 m)   Wt 231 lb (104.8 kg)   LMP 05/18/2018   SpO2 95%   BMI 45.11 kg/m   BP/Weight 06/13/2018 05/29/2018 7/49/4496  Systolic BP 759 163 -  Diastolic BP 71 70 -  Wt. (Lbs) 231 227.5 225  BMI 45.11 44.43 43.94      Physical Exam  Constitutional: She is oriented to person, place, and time.  Well developed, obese, NAD, polite  HENT:  Head: Normocephalic and atraumatic.  Eyes: No scleral icterus.  Neck: Normal range of motion. Neck supple. No thyromegaly present.  Cardiovascular: Normal rate, regular rhythm and normal heart sounds.  Pulmonary/Chest: Effort normal and breath sounds normal.  Abdominal: Soft. Bowel sounds are normal. There is tenderness (mild right pelvic TTP).  Musculoskeletal: She exhibits no edema.  Neurological: She is alert and oriented to person, place, and time.  Skin: Skin is warm and dry. No rash noted. No erythema. No pallor.  Psychiatric: She has a normal mood and affect. Her behavior is normal. Thought content normal.  Vitals reviewed.    Assessment & Plan:   1. Pelvic pain in female - Continue management with GYN for possible resection of large simple cyst of right ovary.   2. Need for prophylactic vaccination and inoculation against influenza - Flu Vaccine QUAD 6+ mos PF IM (Fluarix Quad PF)  3. Fibroma of right thigh - Second referral. Ambulatory referral to Dermatology     Follow-up: Return if symptoms worsen or fail to improve.   Clent Demark PA

## 2018-06-13 NOTE — Patient Instructions (Signed)
Influenza Virus Vaccine injection (Fluarix) Qu es este medicamento? La VACUNA ANTIGRIPAL ayuda a disminuir el riesgo de contraer la influenza, tambin conocida como la gripe. La vacuna solo ayuda a protegerle contra algunas cepas de influenza. Esta vacuna no ayuda a reducir Catering manager de contraer influenza pandmica H1N1. Este medicamento puede ser utilizado para otros usos; si tiene alguna pregunta consulte con su proveedor de atencin mdica o con su farmacutico. MARCAS COMUNES: Fluarix, Fluzone Qu le debo informar a mi profesional de la salud antes de tomar este medicamento? Necesita saber si usted presenta alguno de los siguientes problemas o situaciones: -trastorno de sangrado como hemofilia -fiebre o infeccin -sndrome de Guillain-Barre u otros problemas neurolgicos -problemas del sistema inmunolgico -infeccin por el virus de la inmunodeficiencia humana (VIH) o SIDA -niveles bajos de plaquetas en la sangre -esclerosis mltiple -una Risk analyst o inusual a las vacunas antigripales, a los huevos, protenas de pollo, al ltex, a la gentamicina, a otros medicamentos, alimentos, colorantes o conservantes -si est embarazada o buscando quedar embarazada -si est amamantando a un beb Cmo debo utilizar este medicamento? Esta vacuna se administra mediante inyeccin por va intramuscular. Lo administra un profesional de [employer]	[place]. Recibir una copia de informacin escrita sobre la vacuna antes de cada vacuna. Asegrese de leer este folleto cada vez cuidadosamente. Este folleto puede cambiar con frecuencia. Hable con su pediatra para informarse acerca del uso de este medicamento en nios. Puede requerir atencin especial. Sobredosis: Pngase en contacto inmediatamente con un centro toxicolgico o una sala de urgencia si usted cree que haya tomado demasiado medicamento. ATENCIN: [employer] es solo para usted. No comparta este medicamento con nadie. Qu sucede si me olvido de  una dosis? No se aplica en este caso. Qu puede interactuar con este medicamento? -quimioterapia o radioterapia -medicamentos que suprimen el sistema inmunolgico, tales como etanercept, anakinra, infliximab y adalimumab -medicamentos que tratan o previenen cogulos sanguneos, como warfarina -fenitona -medicamentos esteroideos, como la prednisona o la cortisona -teofilina -vacunas Puede ser que esta lista no menciona todas las posibles interacciones. Informe a su profesional de [employer]	[place] de [employer] productos a base de hierbas, medicamentos de [place] o suplementos nutritivos que est tomando. Si usted fuma, consume bebidas alcohlicas o si utiliza drogas ilegales, indqueselo tambin a su profesional de [employer]	[place]. Algunas sustancias pueden interactuar con su medicamento. A qu debo estar atento al usar [employer]? Informe a su mdico o a Barrister's clerk de la [employer] todos los efectos secundarios que persistan despus de 3 das. Llame a su proveedor de atencin mdica si se presentan sntomas inusuales dentro de las 6 semanas posteriores a la vacunacin. Es posible que todava pueda contraer la gripe, pero la enfermedad no ser tan fuerte como normalmente. No puede contraer la gripe de esta vacuna. La vacuna antigripal no le protege contra resfros u otras enfermedades que pueden causar [place]. Debe vacunarse cada ao. Qu efectos secundarios puedo tener al [employer] este medicamento? Efectos secundarios que debe informar a su mdico o a Barrister's clerk de la salud tan pronto como sea posible: -reacciones alrgicas como erupcin cutnea, picazn o urticarias, hinchazn de la cara, labios o lengua Efectos secundarios que, por lo general, no requieren atencin mdica (debe informarlos a su mdico o a su profesional de la salud si persisten o si son molestos): -fiebre -dolor de cabeza -molestias y dolores musculares -dolor, sensibilidad, enrojecimiento o Estate agent de la  inyeccin -cansancio o debilidad Puede ser que esta lista no  menciona todos los posibles efectos secundarios. Comunquese a su mdico por asesoramiento mdico [employer]. Usted puede informar los efectos secundarios a la FDA por telfono al 1-800-FDA-1088. Dnde debo guardar mi medicina? Esta vacuna se administra solamente en clnicas, farmacias, consultorio mdico u otro consultorio de un profesional de la salud y no Sports coach en su domicilio. ATENCIN: Este folleto es un resumen. Puede ser que no cubra toda la posible informacin. Si usted tiene preguntas acerca de esta medicina, consulte con su mdico, su farmacutico o su profesional de Technical sales engineer.  2018 Elsevier/Gold Standard (2010-03-03 15:31:40)

## 2018-06-23 ENCOUNTER — Ambulatory Visit (INDEPENDENT_AMBULATORY_CARE_PROVIDER_SITE_OTHER): Payer: Self-pay | Admitting: Obstetrics and Gynecology

## 2018-06-23 ENCOUNTER — Encounter: Payer: Self-pay | Admitting: Obstetrics and Gynecology

## 2018-06-23 VITALS — BP 141/51 | HR 70 | Wt 225.7 lb

## 2018-06-23 DIAGNOSIS — N83201 Unspecified ovarian cyst, right side: Secondary | ICD-10-CM

## 2018-06-23 NOTE — Progress Notes (Signed)
GYNECOLOGY OFFICE FOLLOW UP NOTE  History:  37 y.o. [id number] here today for follow up for right ovarian cyst. Has had pain for about 4 months when she bends over or with movement. Pain doesn't happen all the time, only occasional but it is bothersome and she feels it is not getting better quickly. Would like to get pregnant.   Past Medical History:  Diagnosis Date  . Hyperlipidemia   . Medical history non-contributory     Past Surgical History:  Procedure Laterality Date  . NO PAST SURGERIES      Current Outpatient Medications:  .  lovastatin (MEVACOR) 40 MG tablet, Take 1 tablet (40 mg total) by mouth at bedtime. (Patient not taking: Reported on 06/23/2018), Disp: 90 tablet, Rfl: 3  The following portions of the patient's history were reviewed and updated as appropriate: allergies, current medications, past family history, past medical history, past social history, past surgical history and problem list.   Review of Systems:  Pertinent items noted in HPI and remainder of comprehensive ROS otherwise negative.   Objective:  Physical Exam BP (!) 141/51   Pulse 70   Wt 225 lb 11.2 oz (102.4 kg)   LMP 06/20/2018   BMI 44.08 kg/m  CONSTITUTIONAL: Well-developed, well-nourished female in no acute distress.  HENT:  Normocephalic, atraumatic. External right and left ear normal. Oropharynx is clear and moist EYES: Conjunctivae and EOM are normal. Pupils are equal, round, and reactive to light. No scleral icterus.  NECK: Normal range of motion, supple, no masses SKIN: Skin is warm and dry. No rash noted. Not diaphoretic. No erythema. No pallor. NEUROLOGIC: Alert and oriented to person, place, and time. Normal reflexes, muscle tone coordination. No cranial nerve deficit noted. PSYCHIATRIC: Normal mood and affect. Normal behavior. Normal judgment and thought content. CARDIOVASCULAR: Normal heart rate noted RESPIRATORY: Effort normal, no problems with respiration noted ABDOMEN: Soft,  no distention noted.   PELVIC: Normal appearing external genitalia; No abnormal discharge noted.  Normal uterine size, no other palpable masses, no uterine or adnexal tenderness. MUSCULOSKELETAL: Normal range of motion. No edema noted.  Labs and Imaging US Pelvis Transvanginal Non-ob (tv Only)  Result Date: 06/02/2018 CLINICAL DATA:  Follow-up right ovarian cyst EXAM: ULTRASOUND PELVIS TRANSVAGINAL TECHNIQUE: Transvaginal ultrasound examination of the pelvis was performed including evaluation of the uterus, ovaries, adnexal regions, and pelvic cul-de-sac. COMPARISON:  MR pelvis dated 03/21/2018. Pelvic ultrasound dated 02/15/2018. FINDINGS: Uterus Measurements: 8.7 x 5.3 x 7.8 cm. No fibroids or other mass visualized. Endometrium Thickness: 4 mm.  No focal abnormality visualized. Right ovary Measurements: 8.2 x 9.6 x 8.9 cm. 9.3 x 9.1 x 8.0 cm simple cystic lesion without mural nodularity or septations. This previously measured 7.4 x 9.7 x 9.7 cm on MRI, grossly unchanged. Left ovary Measurements: 2.8 x 2.1 x 2.0 cm. Normal appearance/no adnexal mass. Other findings:  No abnormal free fluid IMPRESSION: 9.3 cm simple right ovarian cystic lesion, without overt malignant features, grossly unchanged when compared to recent MRI. Given size, surgical consultation is suggested. Electronically Signed   By: Julian Hy M.D.   On: 06/02/2018 15:33    Assessment & Plan:  1. Cyst of right ovary Reviewed benign appearing cyst approx 9.3 cm. Reviewed that with benign appearance, removal is not required. Discussed that simple cyst would likely not interfere with attempts at pregnancy. I reviewed that indication for removal would be her symptoms, she reports they are ongoing for several months and would like removal. However, after I discussed  laparoscopic removal, risks/benefits of surgery, she elects for conservative management as she has had prior large ovarian cysts which have decreased in size on their own.  Will plan for repeat US and follow up visit in 3 months. She is aware to call with any further concerns/questions/issues. - US PELVIC COMPLETE WITH TRANSVAGINAL; Future   Routine preventative health maintenance measures emphasized. Please refer to After Visit Summary for other counseling recommendations.   Return in about 3 months (around 09/23/2018).   Feliz Beam, M.D. [facility]

## 2018-06-23 NOTE — Progress Notes (Signed)
Video Interpreter # [id number] 

## 2018-08-02 IMAGING — US US TRANSVAGINAL NON-OB
1 series · 13 of 25 positions shown · non-contrast
Comparison: Prior ultrasound from 02/21/2017 as well as prior CT
from 06/11/2017.

CLINICAL DATA: Initial evaluation for left ovarian cyst



[Series 1: us transvaginal non-ob · 0.27mm/px · 83 acquisitions, 13 frames shown]
[im 1/83]
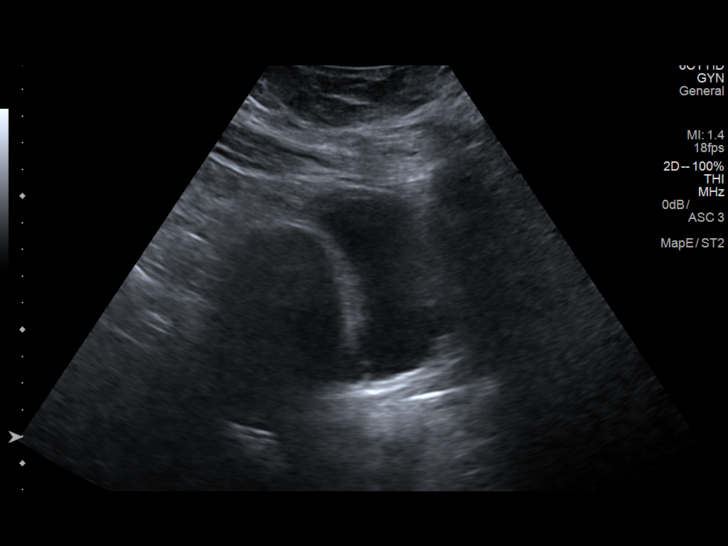
[im 7/83]
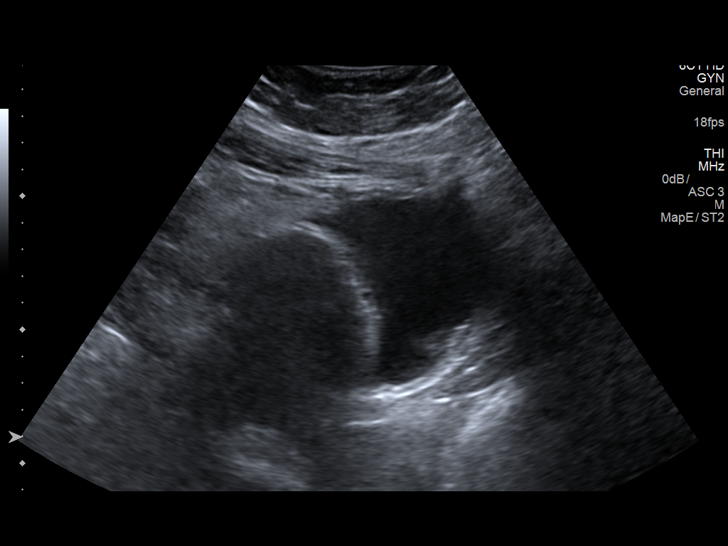
[im 14/83]
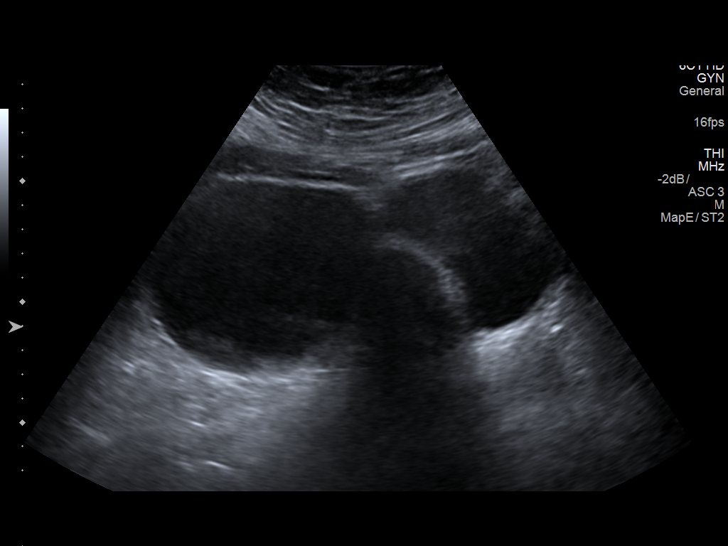
[im 21/83]
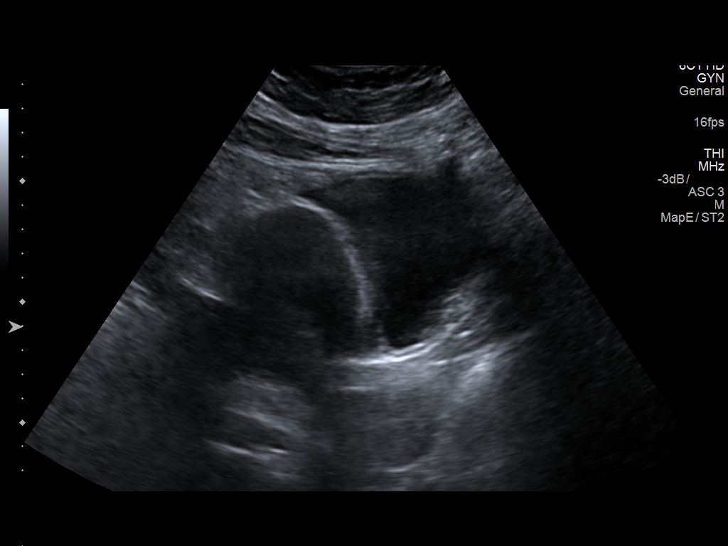
[im 28/83]
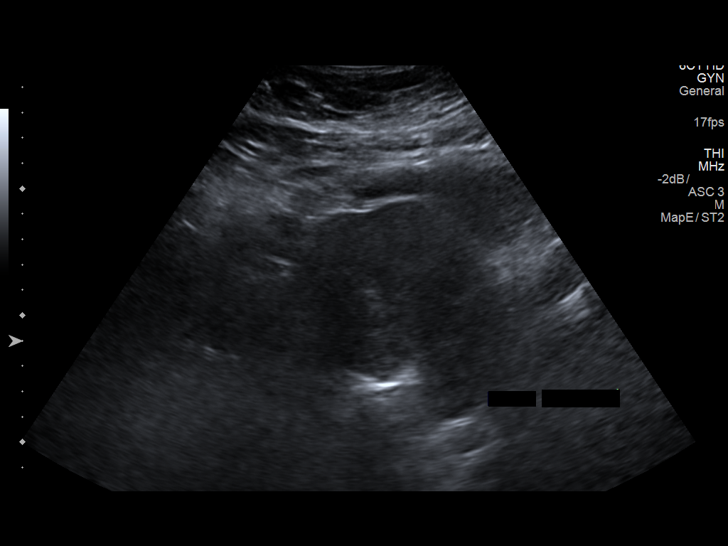
[im 35/83]
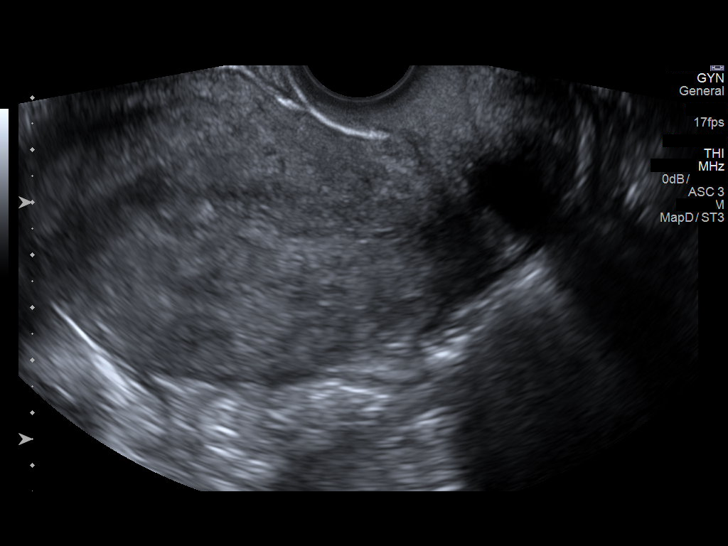
[im 42/83]
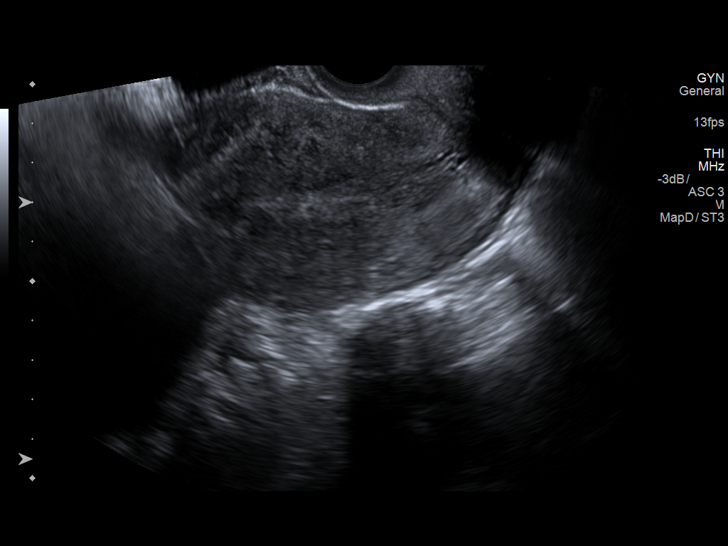
[im 48/83]
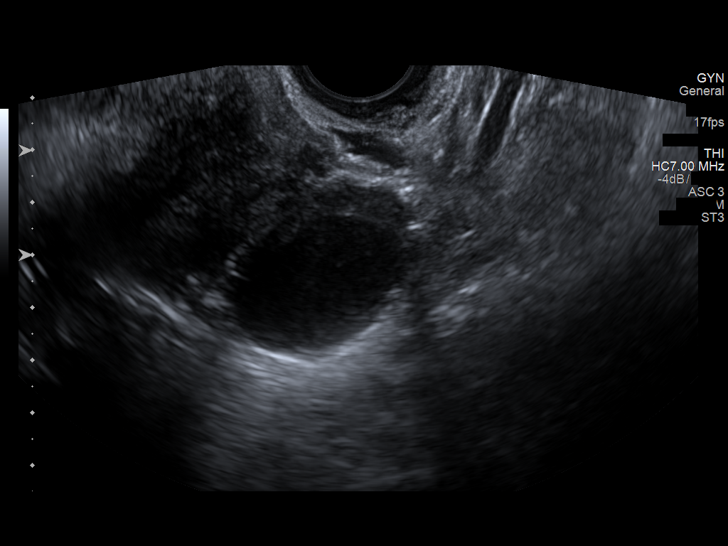
[im 55/83]
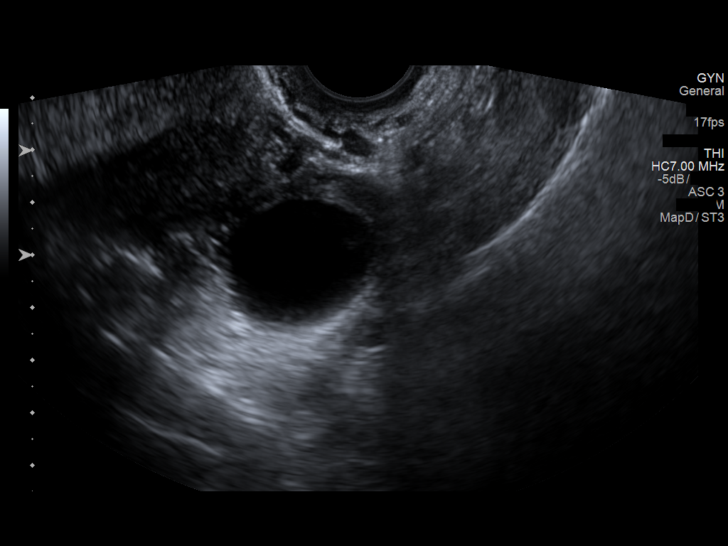
[im 62/83]
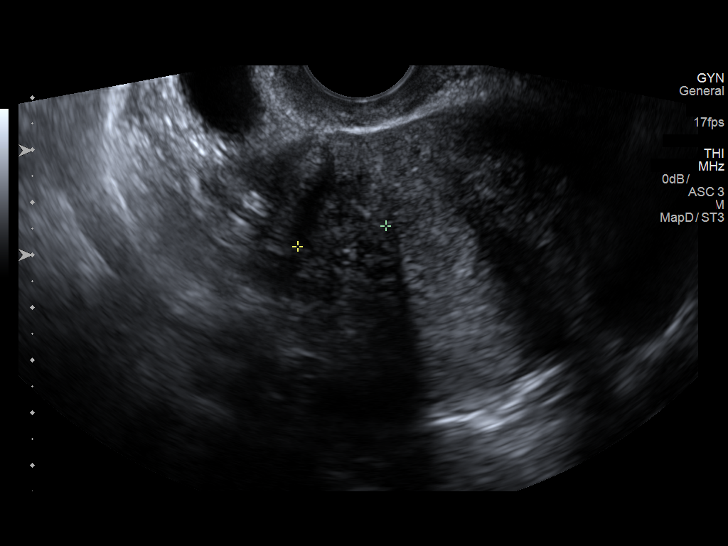
[im 69/83]
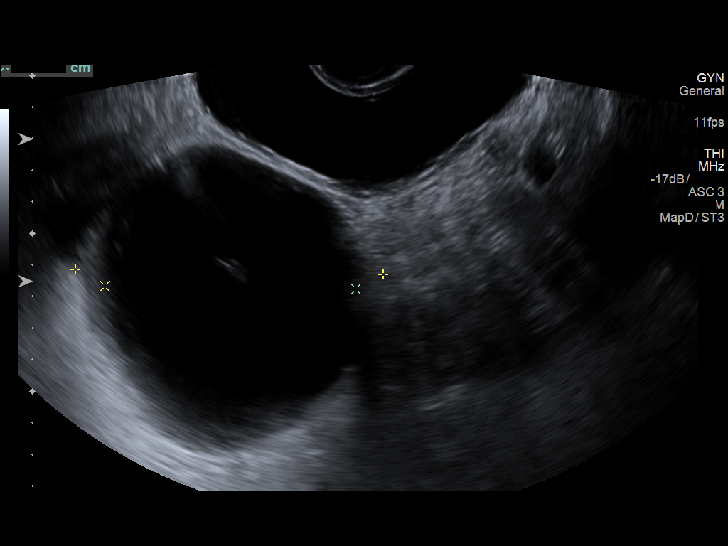
[im 76/83]
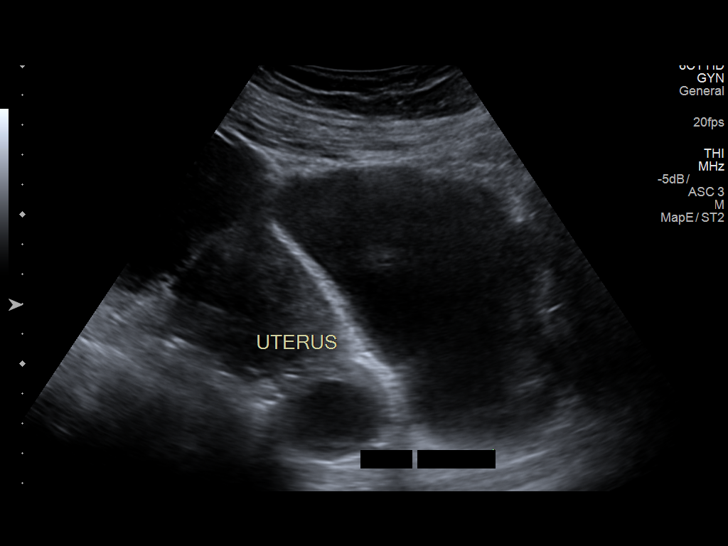
[im 83/83]
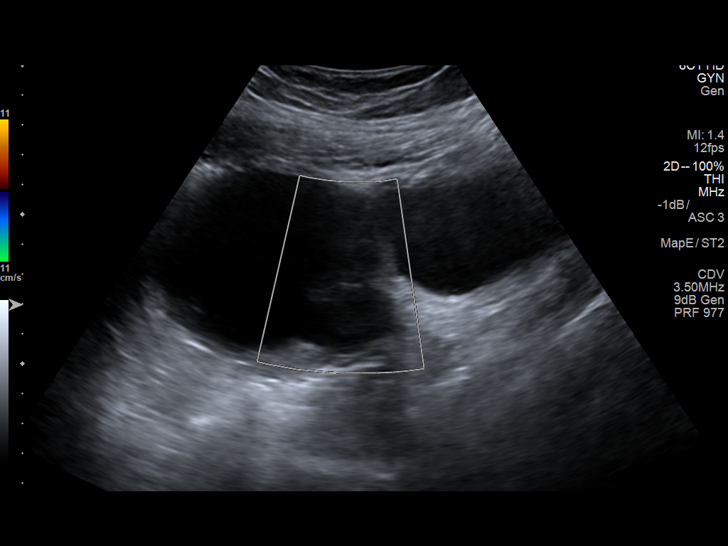

[13 of 25 positions shown; findings below may reference images not displayed]

FINDINGS: Uterus

Measurements: 9.5 x 5.9 x 6.7 cm. Suspected intramural fibroid at
the right uterine fundus measures 2.1 x 2.1 x 1.7 cm

Endometrium

Thickness: 12.7 mm.  No focal abnormality visualized.

Right ovary

Measurements: 9.8 x 7.7 x 9.8 cm. Large predominantly simple right
adnexal cyst measures 9.1 x 7.7 x 8.0 cm, similar relative to prior
CT. No significant internal complexity or vascularity, although
evaluation somewhat limited given size.

Left ovary

Measurements: 4.2 x 3.3 x 4.1 cm. 3.5 x 2.4 x 3.4 cm simple left
ovarian cyst, also similar to previous exams

Other findings

No abnormal free fluid.
IMPRESSION: 1. 9.1 cm simple right adnexal cyst, similar relative to previous CT
from 06/11/2017. Since these may be difficult to assess completely
with US, further evaluation of simple-appearing cysts >7 cm with MRI
or surgical evaluation is recommended according to the Society of
Radiologists in Ultrasound 0191 Consensus Conference Statement (MCFADDEN
Damarys et al. Management of Asymptomatic Ovarian and other Adnexal
Cysts Imaged at US: Society of Radiologists in Ultrasound Consensus
Conference Statement 0191. Radiology [DATE]): [id number].).
2. 3.5 cm simple left ovarian cyst, similar to previous studies.
This is almost certainly benign, with no specific imaging follow-up
recommended for this lesion.
3. Possible 2.1 cm intramural fibroid.

## 2018-09-18 ENCOUNTER — Ambulatory Visit (HOSPITAL_COMMUNITY)
Admission: RE | Admit: 2018-09-18 | Discharge: 2018-09-18 | Disposition: A | Discharge status: Home / Self Care | Payer: Self-pay | Source: Ambulatory Visit | Attending: Obstetrics and Gynecology | Admitting: Obstetrics and Gynecology

## 2018-09-18 DIAGNOSIS — N83201 Unspecified ovarian cyst, right side: Secondary | ICD-10-CM | POA: Insufficient documentation

## 2018-09-20 ENCOUNTER — Telehealth: Payer: Self-pay

## 2018-09-20 NOTE — Telephone Encounter (Signed)
Called pt using [employer] id# [phone] from [employer] to advise of [place] results. No answer, left VM for call back.

## 2018-09-20 NOTE — Telephone Encounter (Signed)
-----   Message from Sloan Leiter, MD sent at 09/20/2018  3:53 PM EST ----- Please call and let patient know cyst is approximately the same size, if she would like it removed for pain, to make an appointment in the office. She is Spanish speaking.

## 2018-09-21 ENCOUNTER — Telehealth: Payer: Self-pay

## 2018-09-21 NOTE — Telephone Encounter (Signed)
Called pt again to give [place] results using [employer] id# [id number] no answer so he left VM for call back. Will send MyChart message & certified letter.

## 2018-10-17 ENCOUNTER — Ambulatory Visit: Payer: Self-pay | Attending: Family Medicine

## 2018-10-23 ENCOUNTER — Telehealth: Payer: Self-pay | Admitting: Physician Assistant

## 2018-10-23 NOTE — Telephone Encounter (Signed)
per application patient is unemployed .. patient would need to provide letter of support.. will hold account for 14 days for letter .., I spoke with Pt will bring a letter as soon as possible

## 2018-11-08 ENCOUNTER — Encounter (INDEPENDENT_AMBULATORY_CARE_PROVIDER_SITE_OTHER): Payer: Self-pay | Admitting: Primary Care

## 2018-11-08 ENCOUNTER — Other Ambulatory Visit: Payer: Self-pay

## 2018-11-08 ENCOUNTER — Ambulatory Visit (INDEPENDENT_AMBULATORY_CARE_PROVIDER_SITE_OTHER): Payer: Self-pay | Admitting: Primary Care

## 2018-11-08 VITALS — BP 130/82 | HR 87 | Temp 97.7°F | Ht 60.0 in | Wt 235.4 lb

## 2018-11-08 DIAGNOSIS — N83202 Unspecified ovarian cyst, left side: Secondary | ICD-10-CM

## 2018-11-08 DIAGNOSIS — K219 Gastro-esophageal reflux disease without esophagitis: Secondary | ICD-10-CM | POA: Insufficient documentation

## 2018-11-08 DIAGNOSIS — Z6841 Body Mass Index (BMI) 40.0 and over, adult: Secondary | ICD-10-CM

## 2018-11-08 DIAGNOSIS — N83201 Unspecified ovarian cyst, right side: Secondary | ICD-10-CM

## 2018-11-08 DIAGNOSIS — E7841 Elevated Lipoprotein(a): Secondary | ICD-10-CM

## 2018-11-08 MED ORDER — OMEPRAZOLE 20 MG PO CPDR: 20.0000 mg | DELAYED_RELEASE_CAPSULE | Freq: Every day | ORAL | 3 refills | Status: DC

## 2018-11-08 NOTE — Patient Instructions (Signed)
Enfermedad de reflujo gastroesofgico en los adultos  Gastroesophageal Reflux Disease, Adult  El reflujo gastroesofgico (RGE) ocurre cuando el cido del estmago sube por el tubo que conecta la boca con el estmago (esfago). Normalmente, la comida baja por el esfago y se mantiene en el estmago, donde se la digiere. Cuando una persona tiene RGE, los alimentos y el cido estomacal suelen volver al esfago. Usted puede tener una enfermedad llamada enfermedad de reflujo gastroesofgico (ERGE) si el reflujo:   Sucede a menudo.   Causa sntomas frecuentes o muy intensos.   Causa problemas tales como dao en el esfago.  Cuando esto ocurre, el esfago duele y se hincha (inflama). Con el tiempo, la ERGE puede ocasionar pequeos agujeros (lceras) en el revestimiento del esfago.  Cules son las causas?  Esta afeccin se debe a un problema en el msculo que se encuentra entre el esfago y el estmago. Cuando este msculo est dbil o no es normal, no se cierra correctamente para impedir que los alimentos y el cido regresen del estmago. El msculo puede debilitarse debido a lo siguiente:   El consumo de tabaco.   Embarazo.   Tener cierto tipo de hernia (hernia de hiato).   Consumo de alcohol.   Ciertos alimentos y bebidas, como caf, chocolate, cebollas y menta.  Qu incrementa el riesgo?  Es ms probable que tenga esta afeccin si:   Tiene sobrepeso.   Tiene una enfermedad que afecta el tejido conjuntivo.   Usa antiinflamatorios no esteroideos (AINE).  Cules son los signos o los sntomas?  Los sntomas de esta afeccin incluyen:   Acidez estomacal.   Dificultad o dolor al tragar.   Sensacin de tener un bulto en la garganta.   Sabor amargo en la boca.   Mal aliento.   Tener una gran cantidad de saliva.   Estmago inflamado o con malestar.   Eructos.   Dolor en el pecho. El dolor de pecho puede deberse a distintas afecciones. Asegrese de consultar a su mdico si tiene dolor en el pecho.   Falta  de aire o respiracin ruidosa (sibilancias).   Tos constante (crnica) o durante la noche.   Desgaste de la superficie de los dientes (esmalte dental).   Prdida de peso.  Cmo se trata?  El tratamiento depender de la gravedad de los sntomas. El mdico puede sugerirle lo siguiente:   Cambios en la dieta.   Medicamentos.   Una ciruga.  Siga estas indicaciones en su casa:  Comida y bebida     Siga una dieta como se lo haya indicado el mdico. Es posible que deba evitar alimentos y bebidas, por ejemplo:  ? Caf y t (con o sin cafena).  ? Bebidas que contengan alcohol.  ? Bebidas energticas y deportivas.  ? Bebidas gaseosas y refrescos.  ? Chocolate y cacao.  ? Menta y esencia de menta.  ? Ajo y cebolla.  ? Rbano picante.  ? Alimentos cidos y condimentados. Estos incluyen todos los tipos de pimientos, chile en polvo, curry en polvo, vinagre, salsas picantes y salsa barbacoa.  ? Ctricos y sus jugos, por ejemplo, naranjas, limones y limas.  ? Alimentos que contengan tomate. Estos incluyen salsa roja, chile, salsa picante y pizza con salsa de tomate.  ? Alimentos fritos y grasos. Estos incluyen donas, papas fritas, papitas fritas de bolsa y aderezos con alto contenido de grasa.  ? Carnes con alto contenido de grasa. Estas incluye los perros calientes, chuletas o costillas, embutidos, jamn y tocino.  ?   Productos lcteos ricos en grasas, como leche entera, manteca y queso crema.   Consuma pequeas cantidades de comida con ms frecuencia. Evite consumir porciones abundantes.   Evite beber grandes cantidades de lquidos con las comidas.   Evite comer 2 o 3horas antes de acostarse.   Evite recostarse inmediatamente despus de comer.   No haga ejercicios enseguida despus de comer.  Estilo de vida     No consuma ningn producto que contenga nicotina o tabaco. Estos incluyen cigarrillos, cigarrillos electrnicos y tabaco para mascar. Si necesita ayuda para dejar de fumar, consulte al mdico.   Intente  reducir el nivel de estrs. Si necesita ayuda para hacer esto, consulte al mdico.   Si tiene sobrepeso, baje una cantidad de peso saludable para usted. Consulte a su mdico para bajar de peso de manera segura.  Indicaciones generales   Est atento a cualquier cambio en los sntomas.   Tome los medicamentos de venta libre y los recetados solamente como se lo haya indicado el mdico. No tome aspirina, ibuprofeno ni otros AINE a menos que el mdico lo autorice.   Use ropa holgada. No use nada apretado alrededor de la cintura.   Levante (eleve) la cabecera de la cama aproximadamente 6pulgadas (15cm).   Evite inclinarse si al hacerlo empeoran los sntomas.   Concurra a todas las visitas de seguimiento como se lo haya indicado el mdico. Esto es importante.  Comunquese con un mdico si:   Aparecen nuevos sntomas.   Adelgaza y no sabe por qu.   Tiene problemas para tragar o le duele cuando traga.   Tiene sibilancias o tos persistente.   Los sntomas no mejoran con el tratamiento.   Tiene la voz ronca.  Solicite ayuda inmediatamente si:   Siente dolor en los brazos, el cuello, la mandbula, los dientes o la espalda.   Se siente transpirado, mareado o tiene una sensacin de desvanecimiento.   Siente falta de aire o dolor en el pecho.   Vomita y el vmito tiene un aspecto similar a la sangre o a los posos de caf.   Pierde el conocimiento (se desmaya).   Las deposiciones (heces) son sanguinolentas o negras.   No puede tragar, beber o comer.  Resumen   Si una persona tiene enfermedad de reflujo gastroesofgico (ERGE), los alimentos y el cido estomacal suben al esfago y causan sntomas o problemas tales como dao en el esfago.   El tratamiento depender de la gravedad de los sntomas.   Siga una dieta como se lo haya indicado el mdico.   Tome todos los medicamentos solamente como se lo haya indicado el mdico.  Esta informacin no tiene como fin reemplazar el consejo del mdico. Asegrese de  hacerle al mdico cualquier pregunta que tenga.  Document Released: 10/02/2010 Document Revised: 04/13/2018 Document Reviewed: 04/13/2018  Elsevier Interactive Patient Education  2019 [employer]

## 2018-11-08 NOTE — Progress Notes (Signed)
Established Patient Office Visit  Subjective:  Patient ID: Kathleen Cline, female    DOB: Nov 09, 1980  Age: 38 y.o. MRN: [id number]  CC:  Chief Complaint  Patient presents with  . Hyperlipidemia  . Gastroesophageal Reflux    HPI Kathleen Cline presents for medications refills for cholesterol but will rtn in AM for FLP.  Past medical history of obesity.  Last office visit 10/19 and seen by GYN  06/23/18 for a cyst on her right ovary.  Today she mentions abdominal pain explained to her that the GYN stated if the pain continue to return for re-evaluation. We discussed morbid obesity and means to decrease her weight she loves rice and tortilla just reduce serving sizes and try to exercise at least 30 mins a day.    Past Medical History:  Diagnosis Date  . Hyperlipidemia   . Medical history non-contributory     Past Surgical History:  Procedure Laterality Date  . NO PAST SURGERIES      Family History  Problem Relation Age of Onset  . Diabetes Mother     Social History   Socioeconomic History  . Marital status: Married    Spouse name: Not on file  . Number of children: Not on file  . Years of education: Not on file  . Highest education level: Not on file  Occupational History  . Not on file  Social Needs  . Financial resource strain: Not on file  . Food insecurity:    Worry: Not on file    Inability: Not on file  . Transportation needs:    Medical: Not on file    Non-medical: Not on file  Tobacco Use  . Smoking status: Never Smoker  . Smokeless tobacco: Never Used  Substance and Sexual Activity  . Alcohol use: No  . Drug use: Not on file  . Sexual activity: Yes    Birth control/protection: None  Lifestyle  . Physical activity:    Days per week: Not on file    Minutes per session: Not on file  . Stress: Not on file  Relationships  . Social connections:    Talks on phone: Not on file    Gets together: Not on file   Attends religious service: Not on file    Active member of club or organization: Not on file    Attends meetings of clubs or organizations: Not on file    Relationship status: Not on file  . Intimate partner violence:    Fear of current or ex partner: Not on file    Emotionally abused: Not on file    Physically abused: Not on file    Forced sexual activity: Not on file  Other Topics Concern  . Not on file  Social History Narrative  . Not on file    Outpatient Medications Prior to Visit  Medication Sig Dispense Refill  . lovastatin (MEVACOR) 40 MG tablet Take 1 tablet (40 mg total) by mouth at bedtime. (Patient not taking: Reported on 06/23/2018) 90 tablet 3   No facility-administered medications prior to visit.     Allergies  Allergen Reactions  . Eggs Or Egg-Derived Products Nausea And Vomiting    ROS Review of Systems  Constitutional: Negative.   HENT: Negative.   Eyes: Negative.   Respiratory: Negative.   Gastrointestinal: Positive for abdominal distention and abdominal pain.  Endocrine: Negative.   Genitourinary: Negative.   Musculoskeletal: Negative.   Allergic/Immunologic: Negative.  Neurological: Negative.   Hematological: Negative.   Psychiatric/Behavioral: Negative.       Objective:    Physical Exam  Constitutional: She is oriented to person, place, and time. She appears well-developed and well-nourished.  HENT:  Head: Normocephalic.  Eyes: Pupils are equal, round, and reactive to light. EOM are normal.  Neck: Normal range of motion. Neck supple.  Cardiovascular: Normal rate and regular rhythm.  Pulmonary/Chest: Effort normal and breath sounds normal.  Abdominal: Soft. Bowel sounds are normal. She exhibits distension. There is abdominal tenderness.  Musculoskeletal: Normal range of motion.  Neurological: She is alert and oriented to person, place, and time.  Skin: Skin is warm and dry.  Psychiatric: She has a normal mood and affect.    BP 130/82  (BP Location: Right Arm, Patient Position: Sitting, Cuff Size: Large)   Pulse 87   Temp 97.7 F (36.5 C) (Oral)   Ht 5' (1.524 m)   Wt 235 lb 6.4 oz (106.8 kg)   LMP 10/08/2018 (Exact Date)   SpO2 92%   BMI 45.97 kg/m  Wt Readings from Last 3 Encounters:  11/08/18 235 lb 6.4 oz (106.8 kg)  06/23/18 225 lb 11.2 oz (102.4 kg)  06/13/18 231 lb (104.8 kg)     There are no preventive care reminders to display for this patient.  There are no preventive care reminders to display for this patient.  Lab Results  Component Value Date   TSH 3.570 10/20/2017   Lab Results  Component Value Date   WBC 6.5 10/20/2017   HGB 12.9 10/20/2017   HCT 39.4 10/20/2017   MCV 77 (L) 10/20/2017   PLT 186 10/20/2017   Lab Results  Component Value Date   NA 141 10/20/2017   K 4.2 10/20/2017   CO2 24 10/20/2017   GLUCOSE 104 (H) 10/20/2017   BUN 7 10/20/2017   CREATININE 0.45 (L) 10/20/2017   BILITOT 0.3 10/20/2017   ALKPHOS 80 10/20/2017   AST 28 10/20/2017   ALT 33 (H) 10/20/2017   PROT 7.7 10/20/2017   ALBUMIN 4.5 10/20/2017   CALCIUM 9.0 10/20/2017   Lab Results  Component Value Date   CHOL 200 (H) 10/20/2017   Lab Results  Component Value Date   HDL 47 10/20/2017   Lab Results  Component Value Date   LDLCALC 120 (H) 10/20/2017   Lab Results  Component Value Date   TRIG 163 (H) 10/20/2017   Lab Results  Component Value Date   CHOLHDL 4.3 10/20/2017   No results found for: HGBA1C    Assessment & Plan:   Problem List Items Addressed This Visit    Elevated lipoprotein(a)   Relevant Orders   Lipid Panel rtn in AM for FLP     Other Visit Diagnoses    Class 3 severe obesity with serious comorbidity and body mass index (BMI) of 45.0 to 49.9 in adult, unspecified obesity type ([place])    -  Primary We discussed morbid obesity and means to decrease her weight she loves rice and tortilla just reduce serving sizes and try to exercise at least 30 mins a day.      Cysts of  both ovaries   Patient can call GYN and schedule appt if pain increase in abdomin     GERD discussed sitting up 30-45 mins after eating , decrease spicy foods and acidic food ie oranges/juice, lime   Meds ordered this encounter  Medications  . omeprazole (PRILOSEC) 20 MG capsule  Sig: Take 1 capsule (20 mg total) by mouth daily.    Dispense:  30 capsule    Refill:  3    Follow-up: Return in about 6 months (around 05/09/2019) for management of cholesterol.    Kerin Perna, NP

## 2018-11-09 ENCOUNTER — Other Ambulatory Visit (INDEPENDENT_AMBULATORY_CARE_PROVIDER_SITE_OTHER): Payer: Self-pay

## 2018-11-09 DIAGNOSIS — E7841 Elevated Lipoprotein(a): Secondary | ICD-10-CM

## 2018-11-09 NOTE — Progress Notes (Signed)
Labs collected by onsite [facility] phlebotomist. Tempestt S Roberts, CMA  

## 2018-11-10 LAB — LIPID PANEL
CHOL/HDL RATIO: 4.8 ratio — AB (ref 0.0–4.4)
Cholesterol, Total: 177 mg/dL (ref 100–199)
HDL: 37 mg/dL — AB (ref >39)
LDL CALC: 88 mg/dL (ref 0–99)
TRIGLYCERIDES: 258 mg/dL — AB (ref 0–149)
VLDL CHOLESTEROL CAL: 52 mg/dL — AB (ref 5–40)

## 2018-12-26 IMAGING — US US TRANSVAGINAL NON-OB
1 series · 15 of 25 positions shown · non-contrast
Comparison: CT abdomen and pelvis on 11/12/2016

CLINICAL DATA: Follow-up right ovarian cyst seen on CT exam.



[Series 1: us transvaginal non-ob · 15 of 77 slices shown]
[im 1/77]
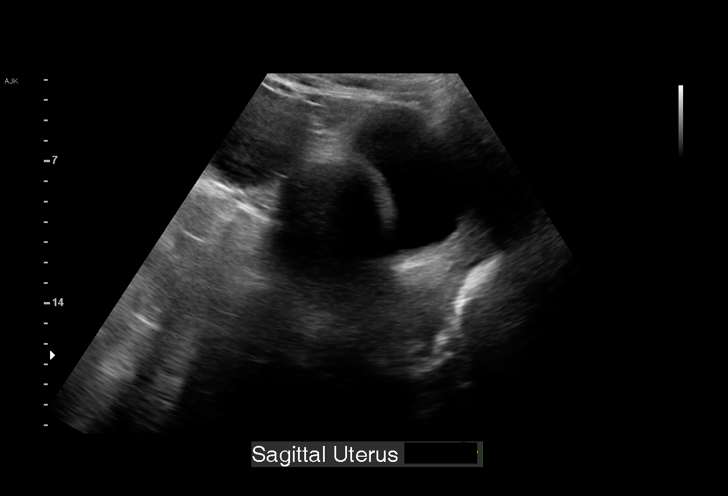
[im 7/77]
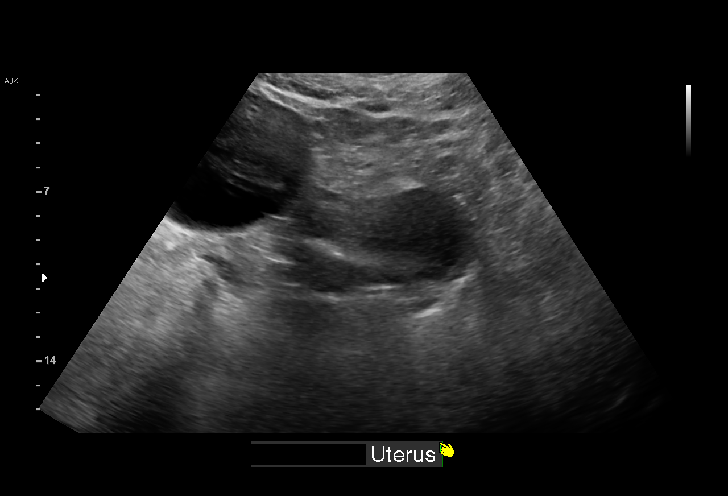
[im 13/77]
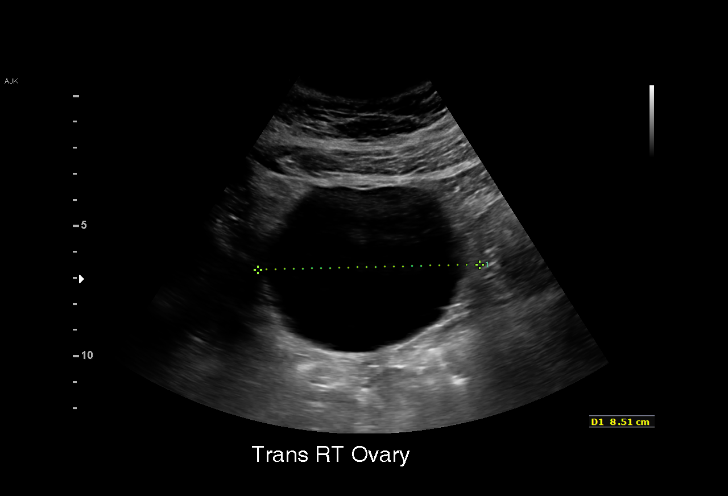
[im 16/77]
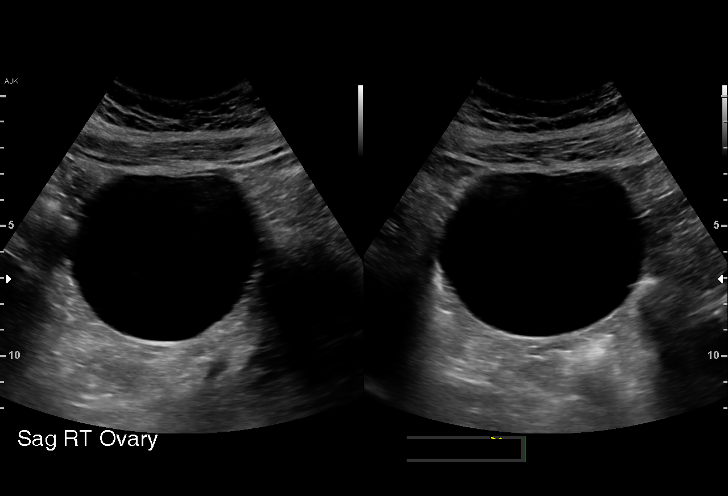
[im 23/77]
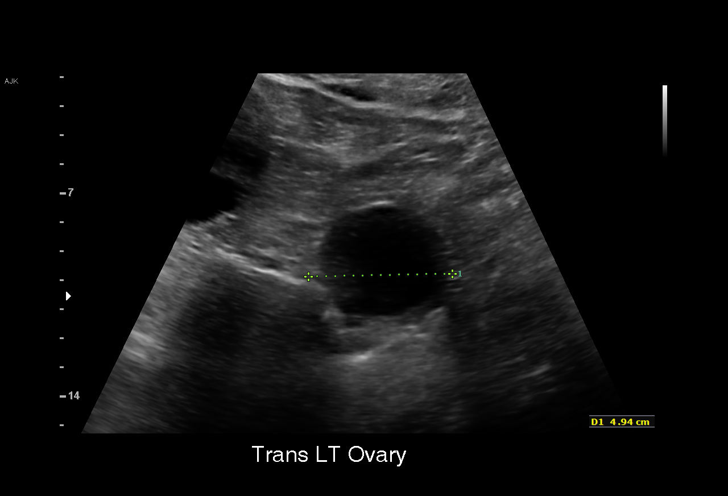
[im 29/77]
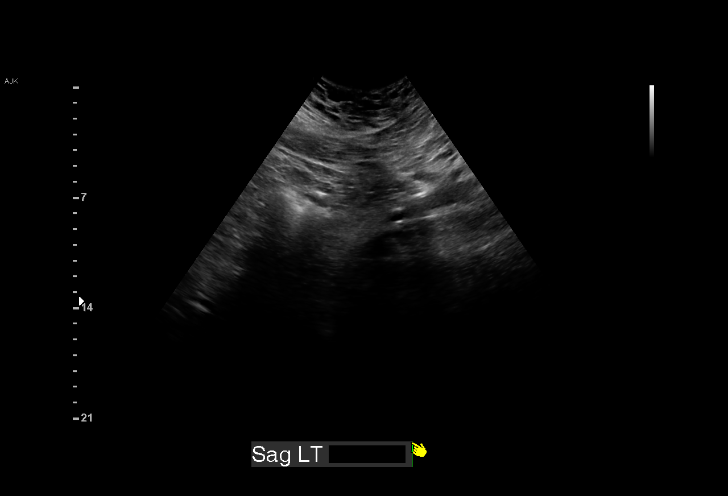
[im 32/77]
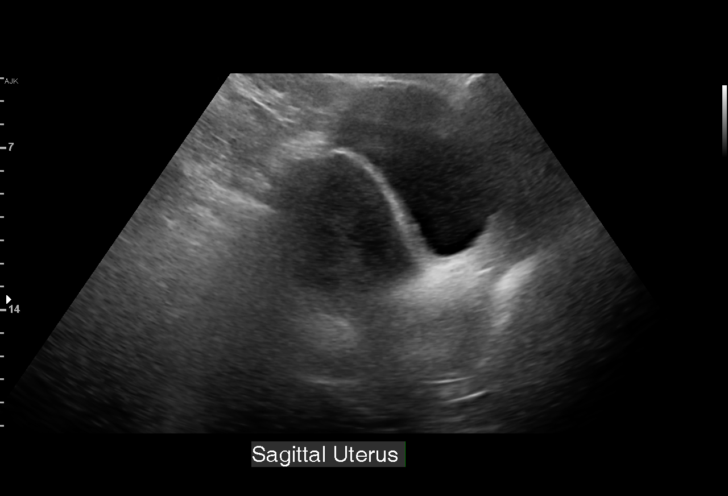
[im 39/77]
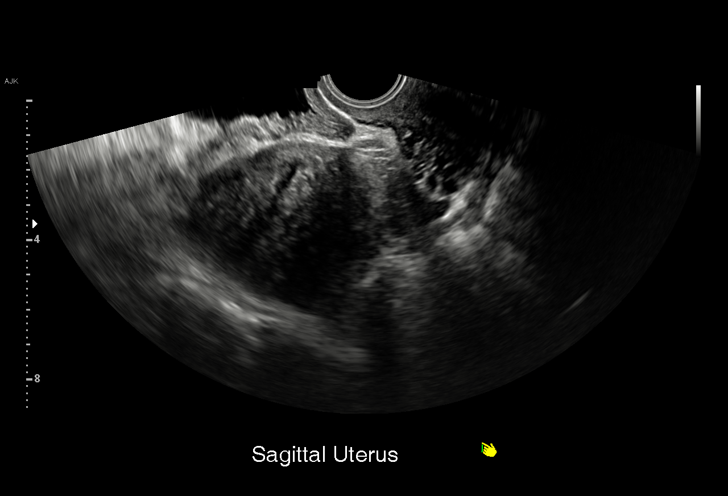
[im 45/77]
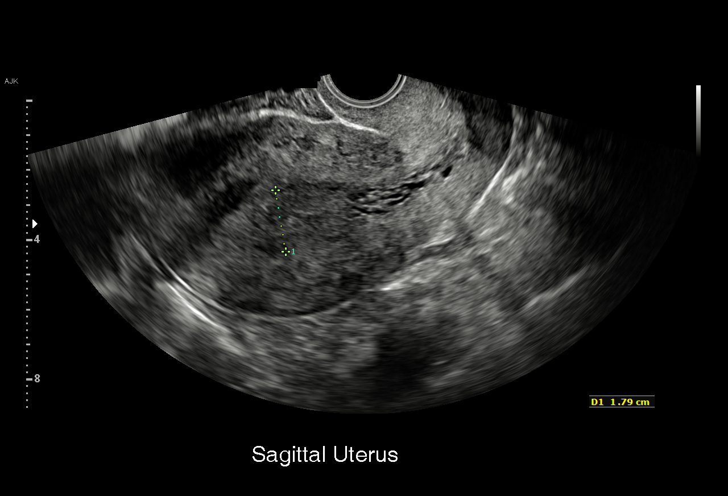
[im 48/77]
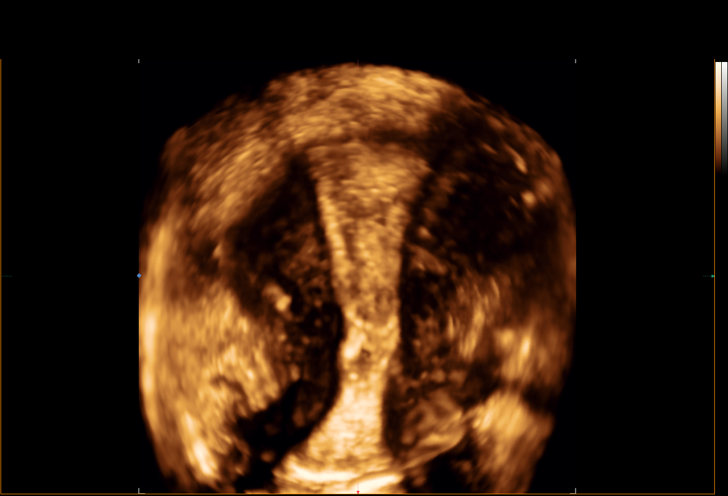
[im 54/77]
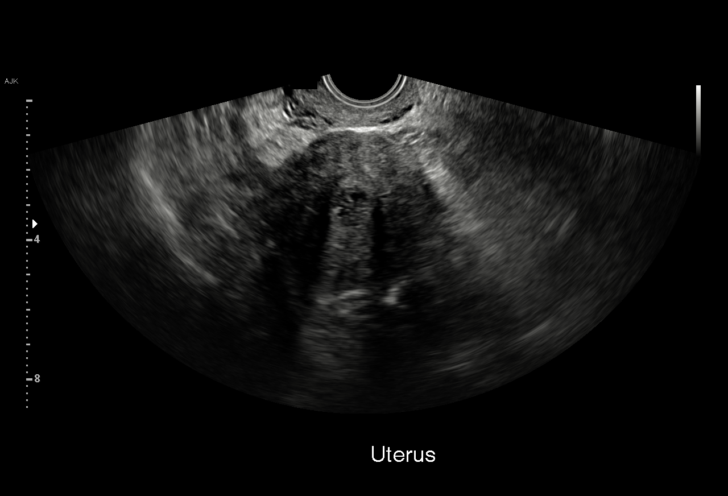
[im 61/77]
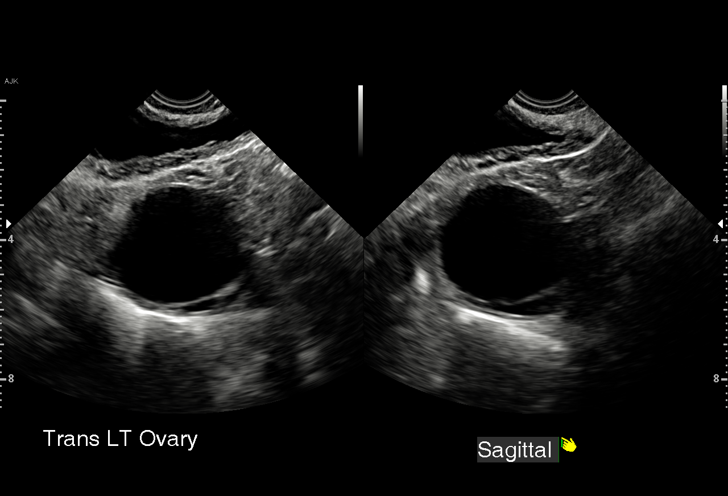
[im 64/77]
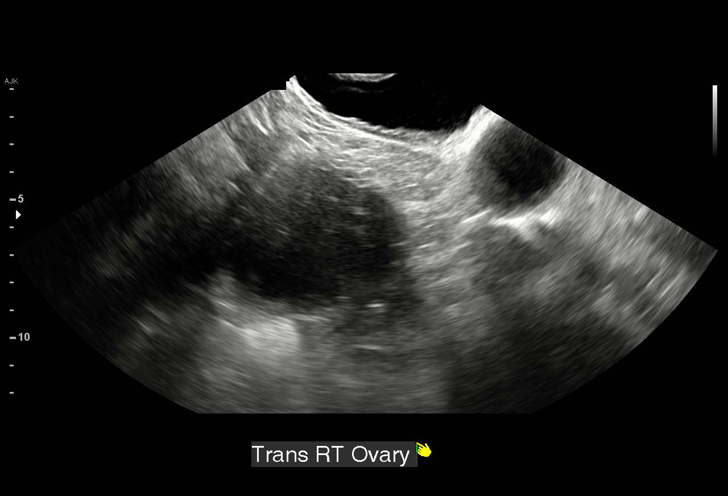
[im 70/77]
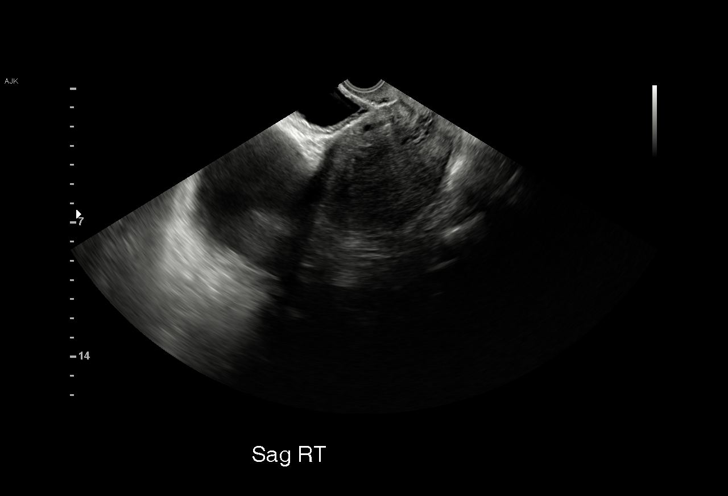
[im 77/77]
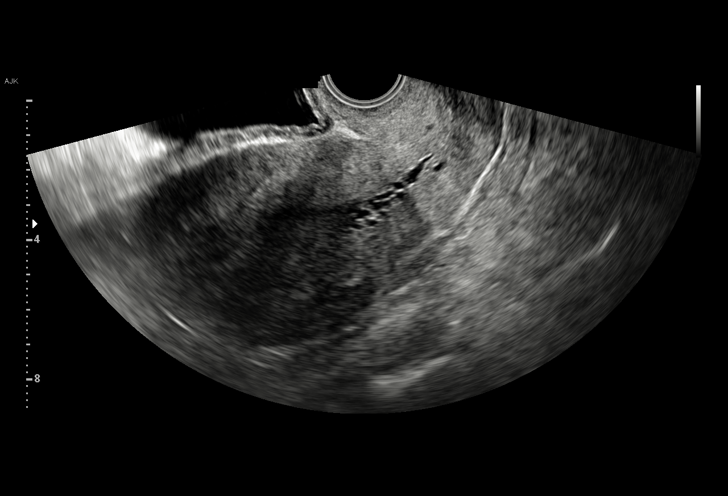

[15 of 25 positions shown; findings below may reference images not displayed]

FINDINGS: Uterus

Measurements: 10.7 x 5.8 x 8.3 cm. Uterus is heterogeneous.

Endometrium

Thickness: 18 mm Heterogeneous in appearance.

Right ovary

Measurements: 7.9 x 7.4 x 9.2 cm. An anechoic cyst is identified
within the right ovary measuring 7.1 x 6.4 x 8.1 cm. A second
component previously identified on the CT exam measuring 3.8 cm is
not identified on the current study.

Left ovary

Measurements: 4.9 x 3.8 x 5.2 cm. Anechoic cyst in the left ovary is
3.8 x 3.4 x 3.9 cm, new since the prior study.

Other findings

No abnormal free fluid.
IMPRESSION: 1. Persistent large right adnexal cyst measuring at least 8.1 cm. A
second component is either obscured sonographically or has resolved.
Given the size and persistence of this lesion, further
characterization with MRI is warranted.
2. Simple cyst in the left adnexa is likely benign. No specific
follow-up is felt to be necessary for this lesion based on consensus
criteria.
3. Thickened endometrial stripe measuring 18 mm. Endometrial
thickness is considered abnormal. Consider follow-up by US in 6-8
weeks, during the week immediately following menses (exam timing is
critical).
These results will be called to the ordering clinician or
representative by the Radiologist Assistant, and communication
documented in the PACS or zVision Dashboard.

## 2019-02-03 IMAGING — US US TRANSVAGINAL NON-OB
1 series · 15 of 25 positions shown · non-contrast
Comparison: None

CLINICAL DATA: Followup ovary cyst

EXAM:
TRANSABDOMINAL AND TRANSVAGINAL ULTRASOUND OF PELVIS
TECHNIQUE: Both transabdominal and transvaginal ultrasound examinations of the
pelvis were performed. Transabdominal technique was performed for
global imaging of the pelvis including uterus, ovaries, adnexal
regions, and pelvic cul-de-sac. It was necessary to proceed with
endovaginal exam following the transabdominal exam to visualize the
uterus, endometrium and ovaries..

[Series 1: us transvaginal non-ob · 15 of 39 slices shown]
[im 1/39]
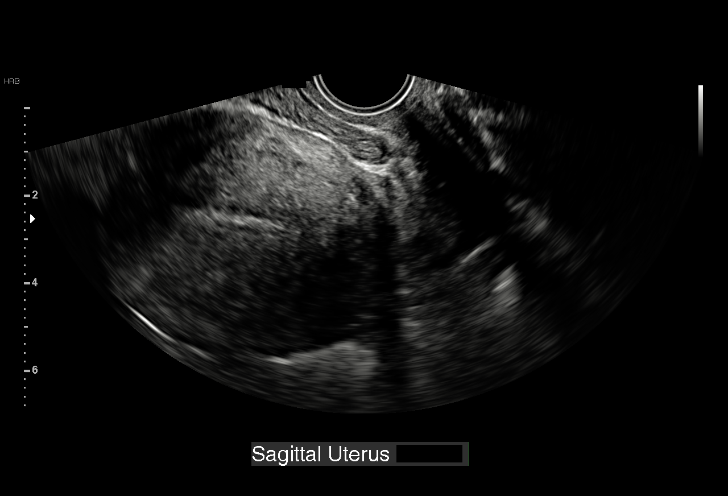
[im 4/39]
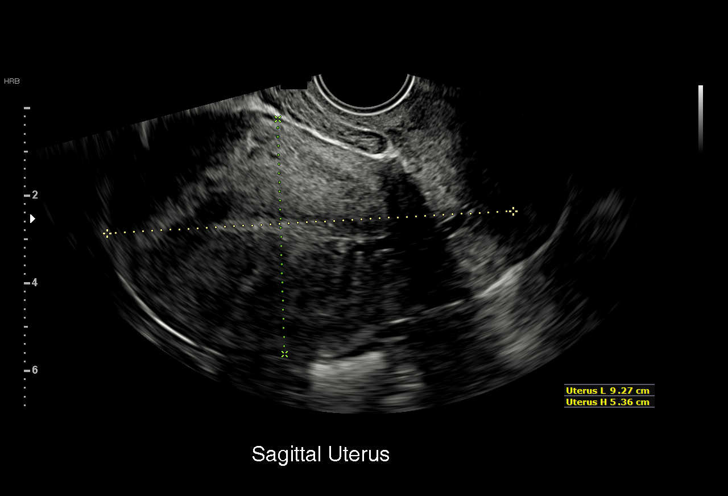
[im 7/39]
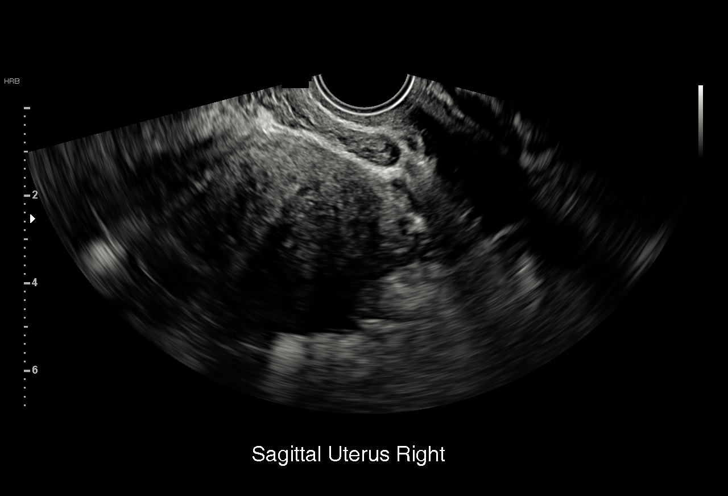
[im 8/39]
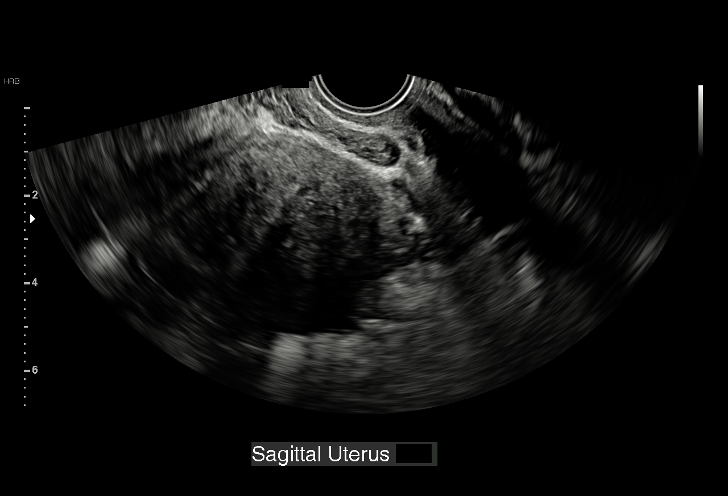
[im 12/39]
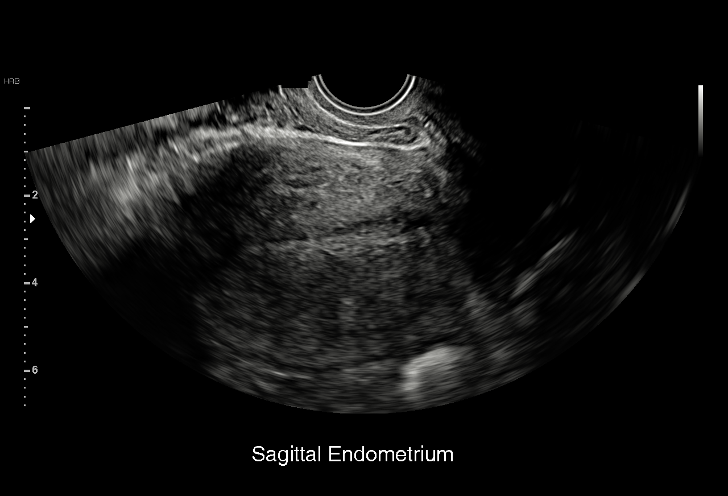
[im 15/39]
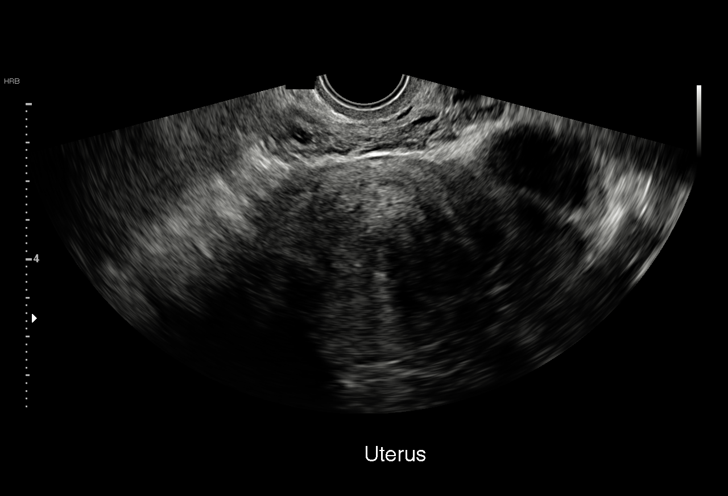
[im 16/39]
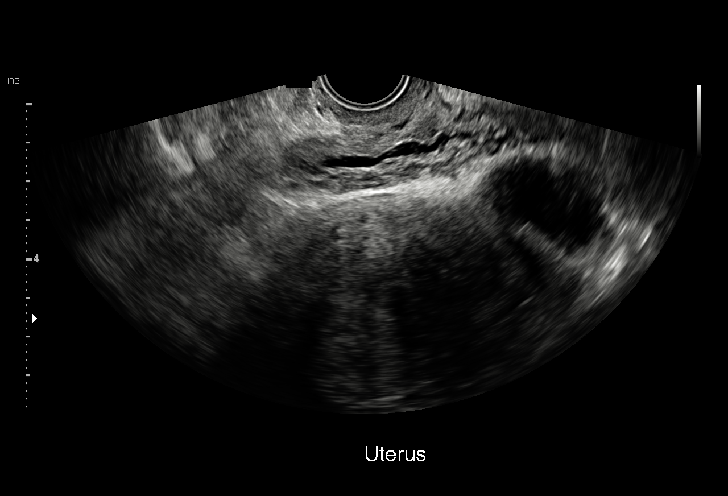
[im 20/39]
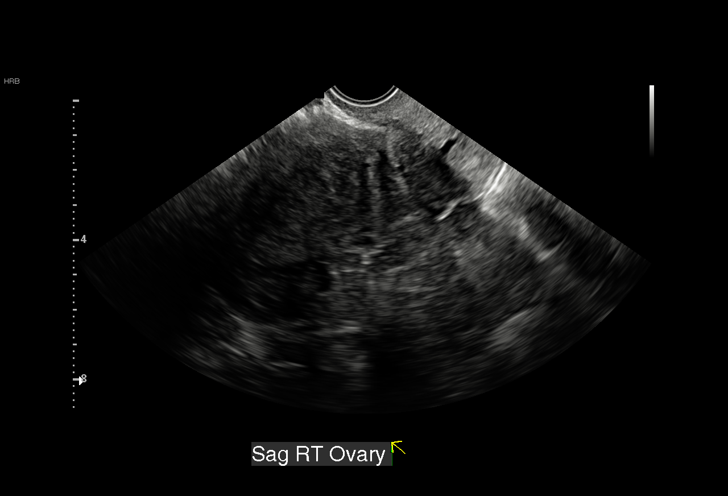
[im 23/39]
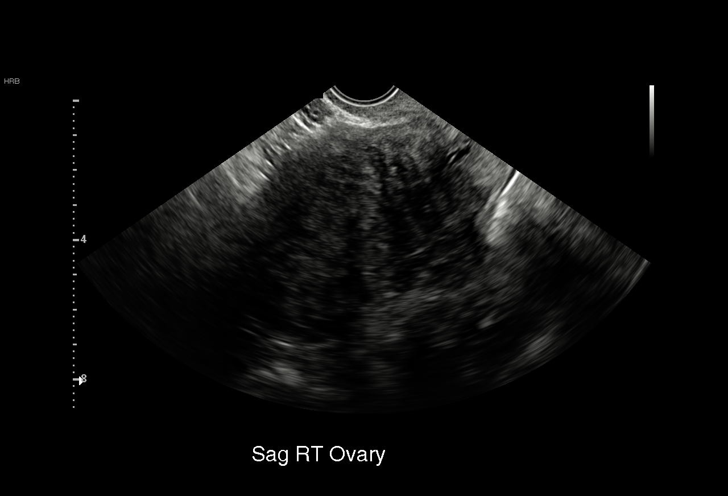
[im 24/39]
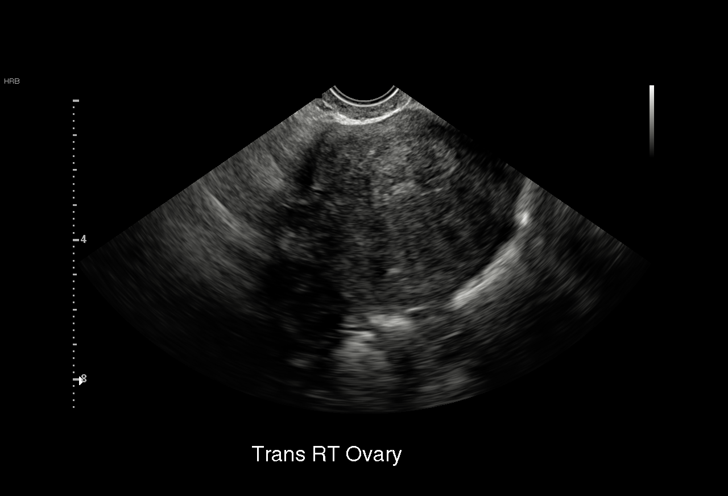
[im 27/39]
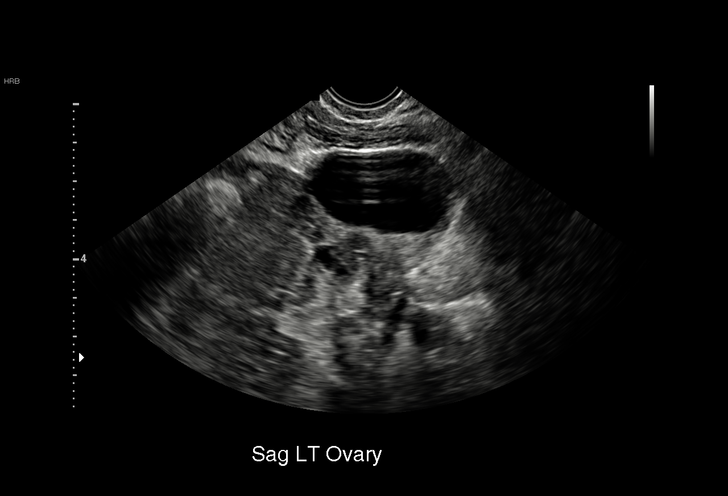
[im 31/39]
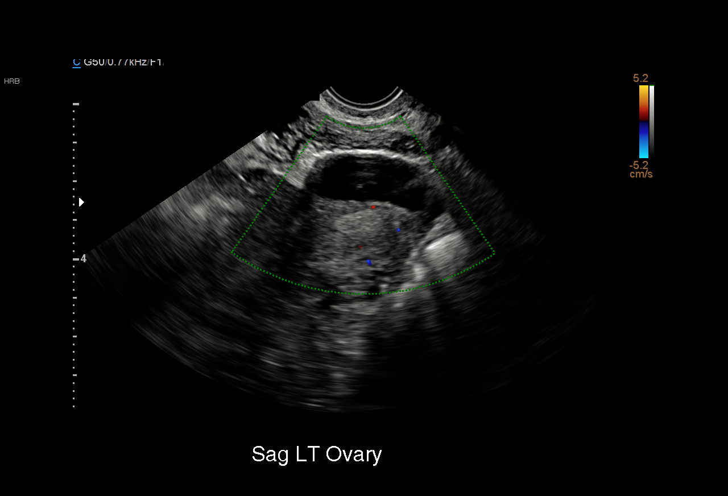
[im 32/39]
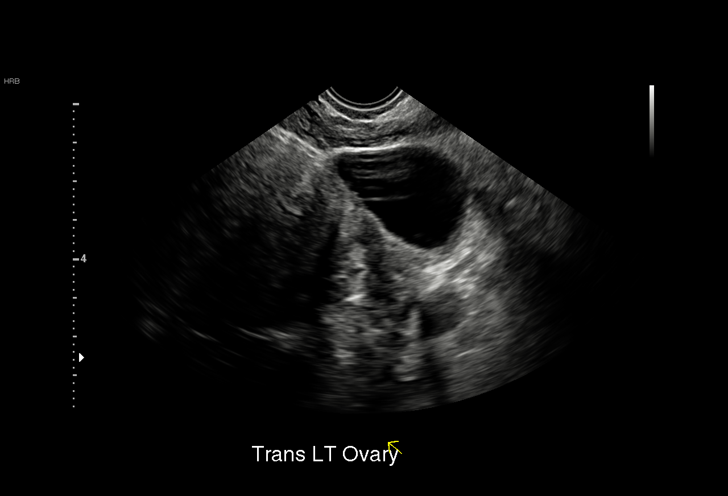
[im 35/39]
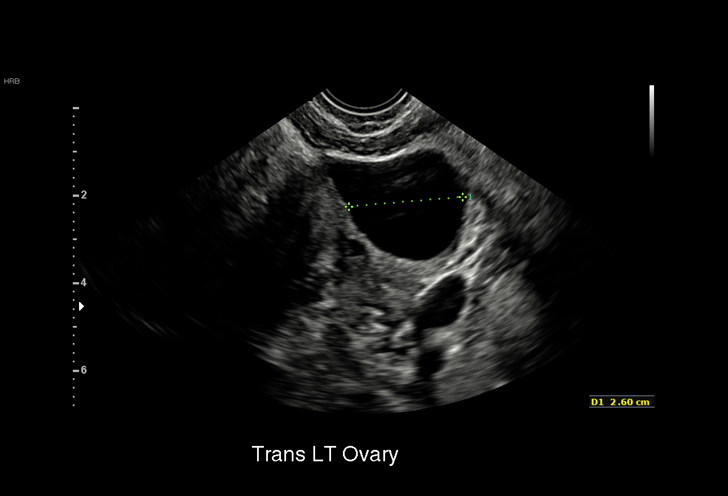
[im 39/39]
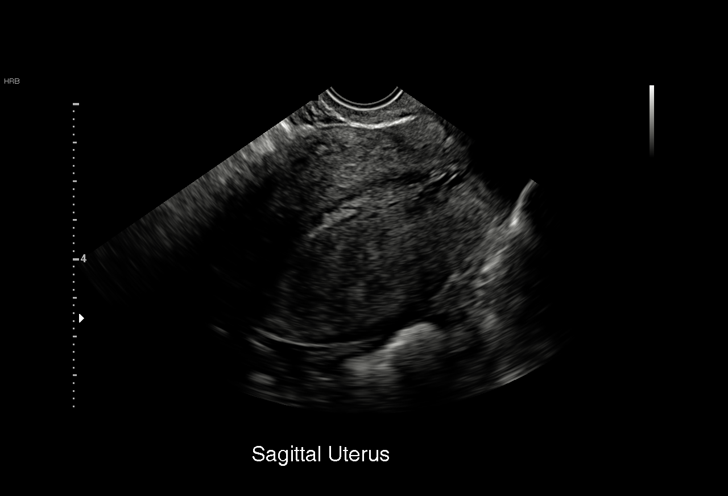

[15 of 25 positions shown; findings below may reference images not displayed]

FINDINGS: Uterus

Measurements: 9.3 x 5.4 x 7.1 cm. No fibroids or other mass
visualized.

Endometrium

Thickness: 4.4 mm.  No focal abnormality visualized.

Right ovary

Measurements: 3.5 x 1.8 x 1.7 cm. The large right adnexal cyst has
resolved in the interval.

Left ovary

Measurements: 4.3 x 3.4 x 3.2 cm. Cyst in the left ovary measures 4
x 2.9 x 2.6 cm. No internal septation or mural nodularity.
Previously 4 x 3.5 x 2.4 cm.

Other findings

No abnormal free fluid.
IMPRESSION: 1. Resolution of previous right ovary cyst.
2. Persistent left ovary cyst measuring 4 cm. This is almost
certainly benign, and no specific imaging follow up is recommended
according to the Society of Radiologists in [id number] Consensus
Conference Statement (Ssr Ko et al. Management of Asymptomatic
Ovarian and Other Adnexal Cysts Imaged at US: Society of
Radiologists in Ultrasound Consensus Conference Statement 0565.
Radiology [DATE]): [id number].).

## 2019-05-09 ENCOUNTER — Ambulatory Visit (INDEPENDENT_AMBULATORY_CARE_PROVIDER_SITE_OTHER): Payer: Self-pay | Admitting: Primary Care

## 2019-05-28 ENCOUNTER — Ambulatory Visit (INDEPENDENT_AMBULATORY_CARE_PROVIDER_SITE_OTHER): Payer: Self-pay | Admitting: Primary Care

## 2019-12-29 ENCOUNTER — Ambulatory Visit: Payer: Self-pay | Attending: Internal Medicine

## 2019-12-29 DIAGNOSIS — Z23 Encounter for immunization: Secondary | ICD-10-CM

## 2019-12-29 NOTE — Progress Notes (Signed)
   Covid-19 Vaccination Clinic  Name:  Kathleen Cline    MRN: [id number] DOB: Nov 29, 1980  12/29/2019  [name] was observed post Covid-19 immunization for 15 minutes without incident. She was provided with Vaccine Information Sheet and instruction to access the V-Safe system.   [name] was instructed to call 911 with any severe reactions post vaccine: Marland Kitchen Difficulty breathing  . Swelling of face and throat  . A fast heartbeat  . A bad rash all over body  . Dizziness and weakness   Immunizations Administered    Name Date Dose VIS Date Route   Pfizer COVID-19 Vaccine 12/29/2019  1:09 PM 0.3 mL 08/24/2019 Intramuscular   Manufacturer: [place]   Lot: [id number]   [place]: [id number]

## 2020-01-21 ENCOUNTER — Ambulatory Visit: Payer: Self-pay | Attending: Internal Medicine

## 2020-01-21 DIAGNOSIS — Z23 Encounter for immunization: Secondary | ICD-10-CM

## 2020-01-21 NOTE — Progress Notes (Signed)
   Covid-19 Vaccination Clinic  Name:  Kathleen Cline    MRN: [id number] DOB: [birth year]  01/21/2020  Ms. Kathleen Cline was observed post Covid-19 immunization for 15 minutes without incident. She was provided with Vaccine Information Sheet and instruction to access the V-Safe system.   Ms. Kathleen Cline was instructed to call 911 with any severe reactions post vaccine: Marland Kitchen Difficulty breathing  . Swelling of face and throat  . A fast heartbeat  . A bad rash all over body  . Dizziness and weakness   Immunizations Administered    Name Date Dose VIS Date Route   Pfizer COVID-19 Vaccine 01/21/2020 10:31 AM 0.3 mL 11/07/2018 Intramuscular   Manufacturer: [place]   Lot: [id number]   [place]: [id number]

## 2022-01-10 ENCOUNTER — Other Ambulatory Visit: Payer: Self-pay

## 2022-01-10 ENCOUNTER — Observation Stay (HOSPITAL_COMMUNITY)
Admission: EM | Admit: 2022-01-10 | Discharge: 2022-01-13 | Disposition: A | Discharge status: Home / Self Care | Payer: Self-pay | Attending: Obstetrics & Gynecology | Admitting: Obstetrics & Gynecology

## 2022-01-10 ENCOUNTER — Encounter (HOSPITAL_COMMUNITY): Payer: Self-pay | Admitting: Oncology

## 2022-01-10 ENCOUNTER — Encounter (HOSPITAL_COMMUNITY): Payer: Self-pay | Admitting: Emergency Medicine

## 2022-01-10 ENCOUNTER — Emergency Department (HOSPITAL_COMMUNITY): Payer: Self-pay

## 2022-01-10 ENCOUNTER — Emergency Department (HOSPITAL_COMMUNITY)
Admission: EM | Admit: 2022-01-10 | Discharge: 2022-01-10 | Disposition: A | Discharge status: Home / Self Care | Payer: Self-pay | Attending: Emergency Medicine | Admitting: Emergency Medicine

## 2022-01-10 DIAGNOSIS — D270 Benign neoplasm of right ovary: Principal | ICD-10-CM | POA: Insufficient documentation

## 2022-01-10 DIAGNOSIS — N83201 Unspecified ovarian cyst, right side: Principal | ICD-10-CM

## 2022-01-10 DIAGNOSIS — N83209 Unspecified ovarian cyst, unspecified side: Secondary | ICD-10-CM | POA: Diagnosis present

## 2022-01-10 DIAGNOSIS — N838 Other noninflammatory disorders of ovary, fallopian tube and broad ligament: Secondary | ICD-10-CM | POA: Diagnosis present

## 2022-01-10 DIAGNOSIS — N760 Acute vaginitis: Secondary | ICD-10-CM | POA: Insufficient documentation

## 2022-01-10 DIAGNOSIS — B9689 Other specified bacterial agents as the cause of diseases classified elsewhere: Secondary | ICD-10-CM

## 2022-01-10 LAB — COMPREHENSIVE METABOLIC PANEL
ALT: 19 U/L (ref 0–44)
AST: 18 U/L (ref 15–41)
Albumin: 4.2 g/dL (ref 3.5–5.0)
Alkaline Phosphatase: 56 U/L (ref 38–126)
Anion gap: 7 (ref 5–15)
BUN: 11 mg/dL (ref 6–20)
CO2: 24 mmol/L (ref 22–32)
Calcium: 8.4 mg/dL — ABNORMAL LOW (ref 8.9–10.3)
Chloride: 107 mmol/L (ref 98–111)
Creatinine, Ser: 0.5 mg/dL (ref 0.44–1.00)
GFR, Estimated: >60 mL/min (ref >60)
Glucose, Bld: 95 mg/dL (ref 70–99)
Potassium: 3.4 mmol/L — ABNORMAL LOW (ref 3.5–5.1)
Sodium: 138 mmol/L (ref 135–145)
Total Bilirubin: 0.7 mg/dL (ref 0.3–1.2)
Total Protein: 7.5 g/dL (ref 6.5–8.1)

## 2022-01-10 LAB — CBC WITH DIFFERENTIAL/PLATELET
Abs Immature Granulocytes: 0.02 10*3/uL (ref 0.00–0.07)
Basophils Absolute: 0 10*3/uL (ref 0.0–0.1)
Basophils Relative: 0 %
Eosinophils Absolute: 0.1 10*3/uL (ref 0.0–0.5)
Eosinophils Relative: 2 %
HCT: 35 % — ABNORMAL LOW (ref 36.0–46.0)
Hemoglobin: 11.6 g/dL — ABNORMAL LOW (ref 12.0–15.0)
Immature Granulocytes: 0 %
Lymphocytes Relative: 30 %
Lymphs Abs: 2.1 10*3/uL (ref 0.7–4.0)
MCH: 28.3 pg (ref 26.0–34.0)
MCHC: 33.1 g/dL (ref 30.0–36.0)
MCV: 85.4 fL (ref 80.0–100.0)
Monocytes Absolute: 0.4 10*3/uL (ref 0.1–1.0)
Monocytes Relative: 6 %
Neutro Abs: 4.1 10*3/uL (ref 1.7–7.7)
Neutrophils Relative %: 62 %
Platelets: 168 10*3/uL (ref 150–400)
RBC: 4.1 MIL/uL (ref 3.87–5.11)
RDW: 14 % (ref 11.5–15.5)
WBC: 6.8 10*3/uL (ref 4.0–10.5)
nRBC: 0 % (ref 0.0–0.2)

## 2022-01-10 LAB — WET PREP, GENITAL
Sperm: NONE SEEN
Trich, Wet Prep: NONE SEEN
WBC, Wet Prep HPF POC: >=10 — AB (ref <10)
Yeast Wet Prep HPF POC: NONE SEEN

## 2022-01-10 LAB — LIPASE, BLOOD: Lipase: 28 U/L (ref 11–51)

## 2022-01-10 LAB — I-STAT BETA HCG BLOOD, ED (MC, WL, AP ONLY): I-stat hCG, quantitative: <5 m[IU]/mL (ref <5)

## 2022-01-10 MED ORDER — KETOROLAC TROMETHAMINE 30 MG/ML IJ SOLN
30.0000 mg | Freq: Once | INTRAMUSCULAR | Status: AC
Start: 2022-01-10 — End: 2022-01-10
  Administered 2022-01-10: 30 mg via INTRAVENOUS
  Filled 2022-01-10: qty 1

## 2022-01-10 MED ORDER — MORPHINE SULFATE (PF) 4 MG/ML IV SOLN
4.0000 mg | Freq: Once | INTRAVENOUS | Status: AC
  Administered 2022-01-10: 4 mg via INTRAVENOUS
  Filled 2022-01-10: qty 1

## 2022-01-10 MED ORDER — ACETAMINOPHEN 500 MG PO TABS: 500.0000 mg | ORAL_TABLET | Freq: Four times a day (QID) | ORAL | 0 refills | Status: AC | PRN

## 2022-01-10 MED ORDER — METRONIDAZOLE 500 MG PO TABS: 500.0000 mg | ORAL_TABLET | Freq: Two times a day (BID) | ORAL | 0 refills | Status: DC

## 2022-01-10 MED ORDER — IBUPROFEN 600 MG PO TABS: 600.0000 mg | ORAL_TABLET | Freq: Four times a day (QID) | ORAL | 0 refills | Status: DC | PRN

## 2022-01-10 NOTE — ED Notes (Signed)
Pt was made aware that a urine sample was needed. ?

## 2022-01-10 NOTE — Discharge Instructions (Addendum)
Tienes vaginosis bacteriana. Esto debe tratarse con metronidazol, que es un antibi?tico. ? ?Su quiste tambi?n es muy grande y necesita un seguimiento con un obstetra/ginec?logo. Su nombre est? arriba, llame a su oficina por la ma?ana para programar una cita pronto. Regresa con empeoramiento de los s?ntomas, fiebre, escalofr?os o sangrado abundante. ?

## 2022-01-10 NOTE — ED Notes (Signed)
Ultrasound at bedside

## 2022-01-10 NOTE — ED Triage Notes (Signed)
Pt c/o lower right abd pain x 1 day ago; was seen at [facility] for same today; pt reports increased pain since there with no relief with '500mg'$  Tylenol or Advil   ?

## 2022-01-10 NOTE — ED Triage Notes (Addendum)
Pt c/o lower abdominal pain. Pt w/ known hx of right ovarian cyst and was advised to come to ER should she ever develop pain. Pt reports pain began yesterday.  Difficulty urinating.  ?

## 2022-01-10 NOTE — ED Provider Notes (Signed)
?[facility] ?Provider Note ? ? ?CSN: [id number] ?Arrival date & time: 01/10/22  8295 ? ?  ? ?History ? ?Chief Complaint  ?Patient presents with  ? Abdominal Pain  ? ? ?Kathleen Cline is a 41 y.o. female with a past medical history of obesity and right-sided ovarian cyst presenting with pelvic pain.  She was diagnosed with a right-sided ovarian cyst in 2019 and told that if she has any pelvic pain she needed to go to the emergency department.  She says for the past 2 weeks she has been having severe right pelvic pain.  Last menstrual period was 1 week ago and she reports that it has been very heavy recently.  Also says her menstrual cycles last 15 days.  This is happened in the past however they were not painful and today she was concerned about the pain.  Reports 2 pregnancies that resulted in 2 kids.  No complications.  Denies any pelvic or abdominal surgeries.  Not on blood thinners. ? ? ?Abdominal Pain ?Associated symptoms: vaginal bleeding   ?Associated symptoms: no chills, no dysuria, no fever, no hematuria, no shortness of breath and no vaginal discharge   ? ?  ? ?Home Medications ?Prior to Admission medications   ?Medication Sig Start Date End Date Taking? Authorizing Provider  ?lovastatin (MEVACOR) 40 MG tablet Take 1 tablet (40 mg total) by mouth at bedtime. ?Patient not taking: Reported on 06/23/2018 11/18/17   Clent Demark, PA-C  ?omeprazole (PRILOSEC) 20 MG capsule Take 1 capsule (20 mg total) by mouth daily. 11/08/18   Kerin Perna, NP  ?   ? ?Allergies    ?Eggs or egg-derived products   ? ?Review of Systems   ?Review of Systems  ?Constitutional:  Negative for chills and fever.  ?Respiratory:  Negative for shortness of breath.   ?Cardiovascular:  Negative for palpitations.  ?Gastrointestinal:  Positive for abdominal pain.  ?Genitourinary:  Positive for pelvic pain and vaginal bleeding. Negative for dysuria, hematuria, vaginal discharge and  vaginal pain.  ?Neurological:  Negative for dizziness.  ?See HPI ? ?Physical Exam ?Updated Vital Signs ?BP (!) 159/75   Pulse 72   Temp 97.9 ?F (36.6 ?C) (Oral)   Resp 20   Ht 5' (1.524 m)   Wt 106.8 kg   LMP 01/03/2022 (Approximate)   SpO2 95%   BMI 45.98 kg/m?  ?Physical Exam ?Vitals and nursing note reviewed.  ?Constitutional:   ?   Appearance: Normal appearance.  ?HENT:  ?   Head: Normocephalic and atraumatic.  ?Eyes:  ?   General: No scleral icterus. ?   Conjunctiva/sclera: Conjunctivae normal.  ?Pulmonary:  ?   Effort: Pulmonary effort is normal. No respiratory distress.  ?Abdominal:  ?   General: Abdomen is flat.  ?   Palpations: Abdomen is soft.  ?   Tenderness: There is no abdominal tenderness.  ?   Hernia: No hernia is present.  ?Genitourinary: ?   Comments: Right pelvic tenderness ?Skin: ?   General: Skin is warm and dry.  ?   Findings: No rash.  ?Neurological:  ?   Mental Status: She is alert.  ?Psychiatric:     ?   Mood and Affect: Mood normal.  ? ? ?ED Results / Procedures / Treatments   ?Labs ?(all labs ordered are listed, but only abnormal results are displayed) ?Labs Reviewed  ?WET PREP, GENITAL - Abnormal; Notable for the following components:  ?    Result  Value  ? Clue Cells Wet Prep HPF POC PRESENT (*)   ? WBC, Wet Prep HPF POC >=10 (*)   ? All other components within normal limits  ?CBC WITH DIFFERENTIAL/PLATELET - Abnormal; Notable for the following components:  ? Hemoglobin 11.6 (*)   ? HCT 35.0 (*)   ? All other components within normal limits  ?COMPREHENSIVE METABOLIC PANEL - Abnormal; Notable for the following components:  ? Potassium 3.4 (*)   ? Calcium 8.4 (*)   ? All other components within normal limits  ?LIPASE, BLOOD  ?URINALYSIS, ROUTINE W REFLEX MICROSCOPIC  ?I-STAT BETA HCG BLOOD, ED ([facility], [facility], [facility] ONLY)  ?GC/CHLAMYDIA PROBE AMP ([place]) NOT AT [facility]  ? ? ?EKG ?None ? ?Radiology ?US Transvaginal Non-OB ? ?Result Date: 01/10/2022 ?CLINICAL DATA:  RIGHT lower quadrant pain  with known cyst. EXAM: TRANSABDOMINAL AND TRANSVAGINAL ULTRASOUND OF PELVIS DOPPLER ULTRASOUND OF OVARIES TECHNIQUE: Both transabdominal and transvaginal ultrasound examinations of the pelvis were performed. Transabdominal technique was performed for global imaging of the pelvis including uterus, ovaries, adnexal regions, and pelvic cul-de-sac. It was necessary to proceed with endovaginal exam following the transabdominal exam to visualize the ovaries particularly LEFT ovary and endometrium. Color and duplex Doppler ultrasound was utilized to evaluate blood flow to the ovaries. COMPARISON:  None. FINDINGS: Uterus Measurements: 10.4 x 6.5 x 7.4 cm = volume: 264 mL. No fibroids or other mass visualized. Endometrium Thickness: 10.0 mm.  No focal abnormality visualized. Right ovary Measurements: 16.2 x 13.8 x 17.1 cm = volume: 2001 mL. Cystic area measuring 40.7 x 11.6 x 14.6 cm is increased in size as compared to the 2019 evaluation where it measured approximately 8 x 9 x 8 cm. Left ovary Measurements: 2.4 x 1.9 x 1.6 cm = volume: 3.7 mL. Normal appearance/no adnexal mass. Pulsed Doppler evaluation of both ovaries demonstrates normal low resistance waveforms on both arterial and venous phase are present on the LEFT. These were obtainable via endovaginal sonography. Waveforms cannot be confirmed on the RIGHT with low resistance pattern, potentially due to technical factors though unclear on the current study. Waveforms were acquired via transabdominal approach only based on ovarian position on the RIGHT-hand side. Other findings No free fluid. IMPRESSION: Large RIGHT ovarian cyst has increased in size, based on size cannot be fully assessed. In the setting of current symptoms given the presence of this large cyst, inability to document low resistance flow, this study is unable to exclude the possibility of ovarian torsion on the RIGHT. MRI may be helpful for further evaluation. Ultimately gyn consultation may be  warranted. This cyst is posterior to the uterus and this may be due to enlargement or abnormal position of the ovary. Abnormal position of the ovary is in other finding that can be seen in the setting of torsion. Internal characteristics of this cystic area are not assessed. Recommend GYN consult and consider pelvis MRI wo and w contrast if clinically warranted. As above. Note: This recommendation does not apply to premenarchal patients or to those with increased risk (genetic, family history, elevated tumor markers or other high-risk factors) of ovarian cancer. Reference: Radiology 2019 Nov; 293(2):359-371. Electronically Signed   By: Zetta Bills M.D.   On: 01/10/2022 11:19  ? ?US Pelvis Complete ? ?Result Date: 01/10/2022 ?CLINICAL DATA:  RIGHT lower quadrant pain with known cyst. EXAM: TRANSABDOMINAL AND TRANSVAGINAL ULTRASOUND OF PELVIS DOPPLER ULTRASOUND OF OVARIES TECHNIQUE: Both transabdominal and transvaginal ultrasound examinations of the pelvis were performed. Transabdominal technique was performed  for global imaging of the pelvis including uterus, ovaries, adnexal regions, and pelvic cul-de-sac. It was necessary to proceed with endovaginal exam following the transabdominal exam to visualize the ovaries particularly LEFT ovary and endometrium. Color and duplex Doppler ultrasound was utilized to evaluate blood flow to the ovaries. COMPARISON:  None. FINDINGS: Uterus Measurements: 10.4 x 6.5 x 7.4 cm = volume: 264 mL. No fibroids or other mass visualized. Endometrium Thickness: 10.0 mm.  No focal abnormality visualized. Right ovary Measurements: 16.2 x 13.8 x 17.1 cm = volume: 2001 mL. Cystic area measuring 40.7 x 11.6 x 14.6 cm is increased in size as compared to the 2019 evaluation where it measured approximately 8 x 9 x 8 cm. Left ovary Measurements: 2.4 x 1.9 x 1.6 cm = volume: 3.7 mL. Normal appearance/no adnexal mass. Pulsed Doppler evaluation of both ovaries demonstrates normal low resistance  waveforms on both arterial and venous phase are present on the LEFT. These were obtainable via endovaginal sonography. Waveforms cannot be confirmed on the RIGHT with low resistance pattern, potentially due to technical facto

## 2022-01-11 ENCOUNTER — Encounter (HOSPITAL_COMMUNITY): Payer: Self-pay | Admitting: Obstetrics & Gynecology

## 2022-01-11 ENCOUNTER — Inpatient Hospital Stay (HOSPITAL_COMMUNITY): Payer: Self-pay

## 2022-01-11 ENCOUNTER — Other Ambulatory Visit: Payer: Self-pay

## 2022-01-11 DIAGNOSIS — N83201 Unspecified ovarian cyst, right side: Secondary | ICD-10-CM

## 2022-01-11 DIAGNOSIS — N83209 Unspecified ovarian cyst, unspecified side: Secondary | ICD-10-CM | POA: Diagnosis present

## 2022-01-11 LAB — CBC WITH DIFFERENTIAL/PLATELET
Abs Immature Granulocytes: 0.03 10*3/uL (ref 0.00–0.07)
Basophils Absolute: 0 10*3/uL (ref 0.0–0.1)
Basophils Relative: 0 %
Eosinophils Absolute: 0.1 10*3/uL (ref 0.0–0.5)
Eosinophils Relative: 2 %
HCT: 37.4 % (ref 36.0–46.0)
Hemoglobin: 11.8 g/dL — ABNORMAL LOW (ref 12.0–15.0)
Immature Granulocytes: 0 %
Lymphocytes Relative: 33 %
Lymphs Abs: 2.7 10*3/uL (ref 0.7–4.0)
MCH: 27.4 pg (ref 26.0–34.0)
MCHC: 31.6 g/dL (ref 30.0–36.0)
MCV: 86.8 fL (ref 80.0–100.0)
Monocytes Absolute: 0.7 10*3/uL (ref 0.1–1.0)
Monocytes Relative: 8 %
Neutro Abs: 4.7 10*3/uL (ref 1.7–7.7)
Neutrophils Relative %: 57 %
Platelets: 170 10*3/uL (ref 150–400)
RBC: 4.31 MIL/uL (ref 3.87–5.11)
RDW: 13.7 % (ref 11.5–15.5)
WBC: 8.3 10*3/uL (ref 4.0–10.5)
nRBC: 0 % (ref 0.0–0.2)

## 2022-01-11 LAB — I-STAT BETA HCG BLOOD, ED (MC, WL, AP ONLY): I-stat hCG, quantitative: <5 m[IU]/mL (ref <5)

## 2022-01-11 LAB — COMPREHENSIVE METABOLIC PANEL
ALT: 18 U/L (ref 0–44)
AST: 19 U/L (ref 15–41)
Albumin: 3.8 g/dL (ref 3.5–5.0)
Alkaline Phosphatase: 58 U/L (ref 38–126)
Anion gap: 7 (ref 5–15)
BUN: 9 mg/dL (ref 6–20)
CO2: 26 mmol/L (ref 22–32)
Calcium: 8.9 mg/dL (ref 8.9–10.3)
Chloride: 103 mmol/L (ref 98–111)
Creatinine, Ser: 0.52 mg/dL (ref 0.44–1.00)
GFR, Estimated: >60 mL/min (ref >60)
Glucose, Bld: 102 mg/dL — ABNORMAL HIGH (ref 70–99)
Potassium: 3.4 mmol/L — ABNORMAL LOW (ref 3.5–5.1)
Sodium: 136 mmol/L (ref 135–145)
Total Bilirubin: 0.5 mg/dL (ref 0.3–1.2)
Total Protein: 7.1 g/dL (ref 6.5–8.1)

## 2022-01-11 LAB — LIPASE, BLOOD: Lipase: 33 U/L (ref 11–51)

## 2022-01-11 LAB — LACTATE DEHYDROGENASE: LDH: 146 U/L (ref 98–192)

## 2022-01-11 MED ORDER — DOCUSATE SODIUM 100 MG PO CAPS
100.0000 mg | ORAL_CAPSULE | Freq: Two times a day (BID) | ORAL | Status: DC
  Administered 2022-01-11 – 2022-01-13 (×3): 100 mg via ORAL
  Filled 2022-01-11 (×3): qty 1

## 2022-01-11 MED ORDER — OXYCODONE-ACETAMINOPHEN 5-325 MG PO TABS
1.0000 | ORAL_TABLET | ORAL | Status: DC | PRN
  Administered 2022-01-11 (×3): 2 via ORAL
  Administered 2022-01-12: 1 via ORAL
  Administered 2022-01-12: 2 via ORAL
  Filled 2022-01-11 (×5): qty 2

## 2022-01-11 MED ORDER — SODIUM CHLORIDE 0.9% FLUSH
3.0000 mL | Freq: Two times a day (BID) | INTRAVENOUS | Status: DC
  Administered 2022-01-11 (×2): 3 mL via INTRAVENOUS

## 2022-01-11 MED ORDER — SODIUM CHLORIDE 0.9% FLUSH: 3.0000 mL | INTRAVENOUS | Status: DC | PRN

## 2022-01-11 MED ORDER — ZOLPIDEM TARTRATE 5 MG PO TABS: 5.0000 mg | ORAL_TABLET | Freq: Every evening | ORAL | Status: DC | PRN

## 2022-01-11 MED ORDER — BISACODYL 5 MG PO TBEC: 5.0000 mg | DELAYED_RELEASE_TABLET | Freq: Every day | ORAL | Status: DC | PRN

## 2022-01-11 MED ORDER — SODIUM CHLORIDE 0.9 % IV SOLN
2.0000 g | INTRAVENOUS | Status: AC
  Filled 2022-01-11: qty 2

## 2022-01-11 MED ORDER — GADOBUTROL 1 MMOL/ML IV SOLN
10.0000 mL | Freq: Once | INTRAVENOUS | Status: AC | PRN
  Administered 2022-01-11: 10 mL via INTRAVENOUS

## 2022-01-11 MED ORDER — ONDANSETRON HCL 4 MG/2ML IJ SOLN
4.0000 mg | Freq: Four times a day (QID) | INTRAMUSCULAR | Status: DC | PRN
  Administered 2022-01-12: 4 mg via INTRAVENOUS

## 2022-01-11 MED ORDER — MAGNESIUM HYDROXIDE 400 MG/5ML PO SUSP: 30.0000 mL | Freq: Every day | ORAL | Status: DC | PRN

## 2022-01-11 MED ORDER — HYDROMORPHONE HCL 1 MG/ML IJ SOLN: 0.2000 mg | INTRAMUSCULAR | Status: DC | PRN

## 2022-01-11 MED ORDER — SODIUM CHLORIDE 0.9 % IV SOLN: 250.0000 mL | INTRAVENOUS | Status: DC | PRN

## 2022-01-11 MED ORDER — IBUPROFEN 600 MG PO TABS: 600.0000 mg | ORAL_TABLET | Freq: Four times a day (QID) | ORAL | Status: DC | PRN

## 2022-01-11 MED ORDER — ALUM & MAG HYDROXIDE-SIMETH 200-200-20 MG/5ML PO SUSP: 30.0000 mL | ORAL | Status: DC | PRN

## 2022-01-11 MED ORDER — ONDANSETRON HCL 4 MG PO TABS: 4.0000 mg | ORAL_TABLET | Freq: Four times a day (QID) | ORAL | Status: DC | PRN

## 2022-01-11 MED ORDER — MORPHINE SULFATE (PF) 4 MG/ML IV SOLN
4.0000 mg | Freq: Once | INTRAVENOUS | Status: AC
  Administered 2022-01-11: 4 mg via INTRAVENOUS
  Filled 2022-01-11: qty 1

## 2022-01-11 MED ORDER — LACTATED RINGERS IV SOLN: INTRAVENOUS | Status: DC

## 2022-01-11 NOTE — H&P (Addendum)
Preoperative History and Physical  Kathleen Cline is a 41 y.o. [phone] with Patient's last menstrual period was 01/03/2022 (approximate). 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  Simple in appearance Admitted for surgical management  PMH:    Past Medical History:  Diagnosis Date   Hyperlipidemia    Medical history non-contributory     PSH:     Past Surgical History:  Procedure Laterality Date   NO PAST SURGERIES      POb/GynH:      OB History     Gravida  2   Para  2   Term  2   Preterm  0   AB  0   Living  2      SAB  0   IAB  0   Ectopic  0   Multiple  0   Live Births  2           SH:   Social History   Tobacco Use   Smoking status: Never   Smokeless tobacco: Never  Vaping Use   Vaping Use: Never used  Substance Use Topics   Alcohol use: No   Drug use: Never    FH:    Family History  Problem Relation Age of Onset   Diabetes Mother      Allergies:  Allergies  Allergen Reactions   Eggs Or Egg-Derived Products Nausea And Vomiting    Medications:      No current facility-administered medications for this encounter.  Review of Systems:   Review of Systems  Constitutional: Negative for fever, chills, weight loss, malaise/fatigue and diaphoresis.  HENT: Negative for hearing loss, ear pain, nosebleeds, congestion, sore throat, neck pain, tinnitus and ear discharge.   Eyes: Negative for blurred vision, double vision, photophobia, pain, discharge and redness.  Respiratory: Negative for cough, hemoptysis, sputum production, shortness of breath, wheezing and stridor.   Cardiovascular: Negative  for chest pain, palpitations, orthopnea, claudication, leg swelling and PND.  Gastrointestinal: Positive for abdominal pain. Negative for heartburn, nausea, vomiting, diarrhea, constipation, blood in stool and melena.  Genitourinary: Negative for dysuria, urgency, frequency, hematuria and flank pain.  Musculoskeletal: Negative for myalgias, back pain, joint pain and falls.  Skin: Negative for itching and rash.  Neurological: Negative for dizziness, tingling, tremors, sensory change, speech change, focal weakness, seizures, loss of consciousness, weakness and headaches.  Endo/Heme/Allergies: Negative for environmental allergies and polydipsia. Does not bruise/bleed easily.  Psychiatric/Behavioral: Negative for depression, suicidal ideas, hallucinations, memory loss and substance abuse. The patient is not nervous/anxious and does not have insomnia.      PHYSICAL EXAM:  Blood pressure 130/71, pulse 62, temperature 98.3 F (36.8 C), temperature source Oral, resp. rate 18, height 5' (1.524 m), weight 106.6 kg, last menstrual period 01/03/2022, SpO2 98 %.    Vitals reviewed. Constitutional: She is oriented to person, place, and time. She appears well-developed and well-nourished.  HENT:  Head: Normocephalic and atraumatic.  Right Ear: External ear normal.  Left Ear: External ear normal.  Nose: Nose normal.  Mouth/Throat: Oropharynx is clear and moist.  Eyes: Conjunctivae and EOM are normal. Pupils are equal, round, and reactive  to light. Right eye exhibits no discharge. Left eye exhibits no discharge. No scleral icterus.  Neck: Normal range of motion. Neck supple. No tracheal deviation present. No thyromegaly present.  Cardiovascular: Normal rate, regular rhythm, normal heart sounds and intact distal pulses.  Exam reveals no gallop and no friction rub.   No murmur heard. Respiratory: Effort normal and breath sounds normal. No respiratory distress. She has no wheezes. She has no rales. She  exhibits no tenderness.  GI: Soft. Bowel sounds are normal. She exhibits no distension and no mass. There is tenderness. There is no rebound and no guarding.  Genitourinary:       Vulva is normal without lesions Vagina is pink moist without discharge Cervix per sonogram Uterus is per sonogram Adnexa is per sonogram Musculoskeletal: Normal range of motion. She exhibits no edema and no tenderness.  Neurological: She is alert and oriented to person, place, and time. She has normal reflexes. She displays normal reflexes. No cranial nerve deficit. She exhibits normal muscle tone. Coordination normal.  Skin: Skin is warm and dry. No rash noted. No erythema. No pallor.  Psychiatric: She has a normal mood and affect. Her behavior is normal. Judgment and thought content normal.    Labs: Results for orders placed or performed during the hospital encounter of 01/10/22 (from the past 336 hour(s))  Comprehensive metabolic panel   Collection Time: 01/11/22  2:30 AM  Result Value Ref Range   Sodium 136 135 - 145 mmol/L   Potassium 3.4 (L) 3.5 - 5.1 mmol/L   Chloride 103 98 - 111 mmol/L   CO2 26 22 - 32 mmol/L   Glucose, Bld 102 (H) 70 - 99 mg/dL   BUN 9 6 - 20 mg/dL   Creatinine, Ser 5.40 0.44 - 1.00 mg/dL   Calcium 8.9 8.9 - 98.1 mg/dL   Total Protein 7.1 6.5 - 8.1 g/dL   Albumin 3.8 3.5 - 5.0 g/dL   AST 19 15 - 41 U/L   ALT 18 0 - 44 U/L   Alkaline Phosphatase 58 38 - 126 U/L   Total Bilirubin 0.5 0.3 - 1.2 mg/dL   GFR, Estimated >19 >14 mL/min   Anion gap 7 5 - 15  Lipase, blood   Collection Time: 01/11/22  2:30 AM  Result Value Ref Range   Lipase 33 11 - 51 U/L  CBC with Diff   Collection Time: 01/11/22  2:30 AM  Result Value Ref Range   WBC 8.3 4.0 - 10.5 K/uL   RBC 4.31 3.87 - 5.11 MIL/uL   Hemoglobin 11.8 (L) 12.0 - 15.0 g/dL   HCT 78.2 95.6 - 21.3 %   MCV 86.8 80.0 - 100.0 fL   MCH 27.4 26.0 - 34.0 pg   MCHC 31.6 30.0 - 36.0 g/dL   RDW 08.6 57.8 - 46.9 %   Platelets 170 150  - 400 K/uL   nRBC 0.0 0.0 - 0.2 %   Neutrophils Relative % 57 %   Neutro Abs 4.7 1.7 - 7.7 K/uL   Lymphocytes Relative 33 %   Lymphs Abs 2.7 0.7 - 4.0 K/uL   Monocytes Relative 8 %   Monocytes Absolute 0.7 0.1 - 1.0 K/uL   Eosinophils Relative 2 %   Eosinophils Absolute 0.1 0.0 - 0.5 K/uL   Basophils Relative 0 %   Basophils Absolute 0.0 0.0 - 0.1 K/uL   Immature Granulocytes 0 %   Abs Immature Granulocytes 0.03 0.00 - 0.07 K/uL  I-Stat beta hCG  blood, ED   Collection Time: 01/11/22  2:39 AM  Result Value Ref Range   I-stat hCG, quantitative <5.0 <5 mIU/mL   Comment 3          Results for orders placed or performed during the hospital encounter of 01/10/22 (from the past 336 hour(s))  Lipase, blood   Collection Time: 01/10/22  8:03 AM  Result Value Ref Range   Lipase 28 11 - 51 U/L  CBC with Differential   Collection Time: 01/10/22  8:03 AM  Result Value Ref Range   WBC 6.8 4.0 - 10.5 K/uL   RBC 4.10 3.87 - 5.11 MIL/uL   Hemoglobin 11.6 (L) 12.0 - 15.0 g/dL   HCT 78.2 (L) 95.6 - 21.3 %   MCV 85.4 80.0 - 100.0 fL   MCH 28.3 26.0 - 34.0 pg   MCHC 33.1 30.0 - 36.0 g/dL   RDW 08.6 57.8 - 46.9 %   Platelets 168 150 - 400 K/uL   nRBC 0.0 0.0 - 0.2 %   Neutrophils Relative % 62 %   Neutro Abs 4.1 1.7 - 7.7 K/uL   Lymphocytes Relative 30 %   Lymphs Abs 2.1 0.7 - 4.0 K/uL   Monocytes Relative 6 %   Monocytes Absolute 0.4 0.1 - 1.0 K/uL   Eosinophils Relative 2 %   Eosinophils Absolute 0.1 0.0 - 0.5 K/uL   Basophils Relative 0 %   Basophils Absolute 0.0 0.0 - 0.1 K/uL   Immature Granulocytes 0 %   Abs Immature Granulocytes 0.02 0.00 - 0.07 K/uL  Comprehensive metabolic panel   Collection Time: 01/10/22  8:03 AM  Result Value Ref Range   Sodium 138 135 - 145 mmol/L   Potassium 3.4 (L) 3.5 - 5.1 mmol/L   Chloride 107 98 - 111 mmol/L   CO2 24 22 - 32 mmol/L   Glucose, Bld 95 70 - 99 mg/dL   BUN 11 6 - 20 mg/dL   Creatinine, Ser 6.29 0.44 - 1.00 mg/dL   Calcium 8.4 (L)  8.9 - 10.3 mg/dL   Total Protein 7.5 6.5 - 8.1 g/dL   Albumin 4.2 3.5 - 5.0 g/dL   AST 18 15 - 41 U/L   ALT 19 0 - 44 U/L   Alkaline Phosphatase 56 38 - 126 U/L   Total Bilirubin 0.7 0.3 - 1.2 mg/dL   GFR, Estimated >52 >84 mL/min   Anion gap 7 5 - 15  I-Stat Beta hCG blood, ED ([facility], [facility], [facility] only)   Collection Time: 01/10/22  9:23 AM  Result Value Ref Range   I-stat hCG, quantitative <5.0 <5 mIU/mL   Comment 3          Wet prep, genital   Collection Time: 01/10/22 11:21 AM   Specimen: PATH Cytology Cervicovaginal Ancillary Only  Result Value Ref Range   Yeast Wet Prep HPF POC NONE SEEN NONE SEEN   Trich, Wet Prep NONE SEEN NONE SEEN   Clue Cells Wet Prep HPF POC PRESENT (A) NONE SEEN   WBC, Wet Prep HPF POC >=10 (A) <10   Sperm NONE SEEN     EKG: No orders found for this or any previous visit.  Imaging Studies: US Transvaginal Non-OB  Addendum Date: 01/10/2022   ADDENDUM REPORT: 01/10/2022 11:49 ADDENDUM: Given that low resistance flow could not be documented in the RIGHT ovary and there is a large ovarian cystic could serve as a nidus for torsion, if there is ongoing concern for ovarian torsion  MRI should be considered for further evaluation. Doppler assessment is of uncertain significance given that the RIGHT ovary could not be assessed endovaginally. As stated previously MRI may be helpful to exclude the possibility of torsion. These results were called by telephone at the time of interpretation on 01/10/2022 at 11:49 am to provider MADISON REDWINE , who verbally acknowledged these results. Electronically Signed   By: Donzetta Kohut M.D.   On: 01/10/2022 11:49   Result Date: 01/10/2022 CLINICAL DATA:  RIGHT lower quadrant pain with known cyst. EXAM: TRANSABDOMINAL AND TRANSVAGINAL ULTRASOUND OF PELVIS DOPPLER ULTRASOUND OF OVARIES TECHNIQUE: Both transabdominal and transvaginal ultrasound examinations of the pelvis were performed. Transabdominal technique was performed for global  imaging of the pelvis including uterus, ovaries, adnexal regions, and pelvic cul-de-sac. It was necessary to proceed with endovaginal exam following the transabdominal exam to visualize the ovaries particularly LEFT ovary and endometrium. Color and duplex Doppler ultrasound was utilized to evaluate blood flow to the ovaries. COMPARISON:  None. FINDINGS: Uterus Measurements: 10.4 x 6.5 x 7.4 cm = volume: 264 mL. No fibroids or other mass visualized. Endometrium Thickness: 10.0 mm.  No focal abnormality visualized. Right ovary Measurements: 16.2 x 13.8 x 17.1 cm = volume: 2001 mL. Cystic area measuring 40.7 x 11.6 x 14.6 cm is increased in size as compared to the 2019 evaluation where it measured approximately 8 x 9 x 8 cm. Left ovary Measurements: 2.4 x 1.9 x 1.6 cm = volume: 3.7 mL. Normal appearance/no adnexal mass. Pulsed Doppler evaluation of both ovaries demonstrates normal low resistance waveforms on both arterial and venous phase are present on the LEFT. These were obtainable via endovaginal sonography. Waveforms cannot be confirmed on the RIGHT with low resistance pattern, potentially due to technical factors though unclear on the current study. Waveforms were acquired via transabdominal approach only based on ovarian position on the RIGHT-hand side. Other findings No free fluid. IMPRESSION: Large RIGHT ovarian cyst has increased in size, based on size cannot be fully assessed. In the setting of current symptoms given the presence of this large cyst, inability to document low resistance flow, this study is unable to exclude the possibility of ovarian torsion on the RIGHT. MRI may be helpful for further evaluation. Ultimately gyn consultation may be warranted. This cyst is posterior to the uterus and this may be due to enlargement or abnormal position of the ovary. Abnormal position of the ovary is in other finding that can be seen in the setting of torsion. Internal characteristics of this cystic area are not  assessed. Recommend GYN consult and consider pelvis MRI wo and w contrast if clinically warranted. As above. Note: This recommendation does not apply to premenarchal patients or to those with increased risk (genetic, family history, elevated tumor markers or other high-risk factors) of ovarian cancer. Reference: Radiology 2019 Nov; 293(2):359-371. Electronically Signed: By: Donzetta Kohut M.D. On: 01/10/2022 11:19   US Pelvis Complete  Addendum Date: 01/10/2022   ADDENDUM REPORT: 01/10/2022 11:49 ADDENDUM: Given that low resistance flow could not be documented in the RIGHT ovary and there is a large ovarian cystic could serve as a nidus for torsion, if there is ongoing concern for ovarian torsion MRI should be considered for further evaluation. Doppler assessment is of uncertain significance given that the RIGHT ovary could not be assessed endovaginally. As stated previously MRI may be helpful to exclude the possibility of torsion. These results were called by telephone at the time of interpretation on 01/10/2022 at 11:49 am  to provider MADISON REDWINE , who verbally acknowledged these results. Electronically Signed   By: Donzetta Kohut M.D.   On: 01/10/2022 11:49   Result Date: 01/10/2022 CLINICAL DATA:  RIGHT lower quadrant pain with known cyst. EXAM: TRANSABDOMINAL AND TRANSVAGINAL ULTRASOUND OF PELVIS DOPPLER ULTRASOUND OF OVARIES TECHNIQUE: Both transabdominal and transvaginal ultrasound examinations of the pelvis were performed. Transabdominal technique was performed for global imaging of the pelvis including uterus, ovaries, adnexal regions, and pelvic cul-de-sac. It was necessary to proceed with endovaginal exam following the transabdominal exam to visualize the ovaries particularly LEFT ovary and endometrium. Color and duplex Doppler ultrasound was utilized to evaluate blood flow to the ovaries. COMPARISON:  None. FINDINGS: Uterus Measurements: 10.4 x 6.5 x 7.4 cm = volume: 264 mL. No fibroids or other  mass visualized. Endometrium Thickness: 10.0 mm.  No focal abnormality visualized. Right ovary Measurements: 16.2 x 13.8 x 17.1 cm = volume: 2001 mL. Cystic area measuring 40.7 x 11.6 x 14.6 cm is increased in size as compared to the 2019 evaluation where it measured approximately 8 x 9 x 8 cm. Left ovary Measurements: 2.4 x 1.9 x 1.6 cm = volume: 3.7 mL. Normal appearance/no adnexal mass. Pulsed Doppler evaluation of both ovaries demonstrates normal low resistance waveforms on both arterial and venous phase are present on the LEFT. These were obtainable via endovaginal sonography. Waveforms cannot be confirmed on the RIGHT with low resistance pattern, potentially due to technical factors though unclear on the current study. Waveforms were acquired via transabdominal approach only based on ovarian position on the RIGHT-hand side. Other findings No free fluid. IMPRESSION: Large RIGHT ovarian cyst has increased in size, based on size cannot be fully assessed. In the setting of current symptoms given the presence of this large cyst, inability to document low resistance flow, this study is unable to exclude the possibility of ovarian torsion on the RIGHT. MRI may be helpful for further evaluation. Ultimately gyn consultation may be warranted. This cyst is posterior to the uterus and this may be due to enlargement or abnormal position of the ovary. Abnormal position of the ovary is in other finding that can be seen in the setting of torsion. Internal characteristics of this cystic area are not assessed. Recommend GYN consult and consider pelvis MRI wo and w contrast if clinically warranted. As above. Note: This recommendation does not apply to premenarchal patients or to those with increased risk (genetic, family history, elevated tumor markers or other high-risk factors) of ovarian cancer. Reference: Radiology 2019 Nov; 293(2):359-371. Electronically Signed: By: Donzetta Kohut M.D. On: 01/10/2022 11:19   [place] Art/Ven  Flow Abd Pelv Doppler  Addendum Date: 01/10/2022   ADDENDUM REPORT: 01/10/2022 11:49 ADDENDUM: Given that low resistance flow could not be documented in the RIGHT ovary and there is a large ovarian cystic could serve as a nidus for torsion, if there is ongoing concern for ovarian torsion MRI should be considered for further evaluation. Doppler assessment is of uncertain significance given that the RIGHT ovary could not be assessed endovaginally. As stated previously MRI may be helpful to exclude the possibility of torsion. These results were called by telephone at the time of interpretation on 01/10/2022 at 11:49 am to provider MADISON REDWINE , who verbally acknowledged these results. Electronically Signed   By: Donzetta Kohut M.D.   On: 01/10/2022 11:49   Result Date: 01/10/2022 CLINICAL DATA:  RIGHT lower quadrant pain with known cyst. EXAM: TRANSABDOMINAL AND TRANSVAGINAL ULTRASOUND OF PELVIS DOPPLER ULTRASOUND OF  OVARIES TECHNIQUE: Both transabdominal and transvaginal ultrasound examinations of the pelvis were performed. Transabdominal technique was performed for global imaging of the pelvis including uterus, ovaries, adnexal regions, and pelvic cul-de-sac. It was necessary to proceed with endovaginal exam following the transabdominal exam to visualize the ovaries particularly LEFT ovary and endometrium. Color and duplex Doppler ultrasound was utilized to evaluate blood flow to the ovaries. COMPARISON:  None. FINDINGS: Uterus Measurements: 10.4 x 6.5 x 7.4 cm = volume: 264 mL. No fibroids or other mass visualized. Endometrium Thickness: 10.0 mm.  No focal abnormality visualized. Right ovary Measurements: 16.2 x 13.8 x 17.1 cm = volume: 2001 mL. Cystic area measuring 40.7 x 11.6 x 14.6 cm is increased in size as compared to the 2019 evaluation where it measured approximately 8 x 9 x 8 cm. Left ovary Measurements: 2.4 x 1.9 x 1.6 cm = volume: 3.7 mL. Normal appearance/no adnexal mass. Pulsed Doppler evaluation  of both ovaries demonstrates normal low resistance waveforms on both arterial and venous phase are present on the LEFT. These were obtainable via endovaginal sonography. Waveforms cannot be confirmed on the RIGHT with low resistance pattern, potentially due to technical factors though unclear on the current study. Waveforms were acquired via transabdominal approach only based on ovarian position on the RIGHT-hand side. Other findings No free fluid. IMPRESSION: Large RIGHT ovarian cyst has increased in size, based on size cannot be fully assessed. In the setting of current symptoms given the presence of this large cyst, inability to document low resistance flow, this study is unable to exclude the possibility of ovarian torsion on the RIGHT. MRI may be helpful for further evaluation. Ultimately gyn consultation may be warranted. This cyst is posterior to the uterus and this may be due to enlargement or abnormal position of the ovary. Abnormal position of the ovary is in other finding that can be seen in the setting of torsion. Internal characteristics of this cystic area are not assessed. Recommend GYN consult and consider pelvis MRI wo and w contrast if clinically warranted. As above. Note: This recommendation does not apply to premenarchal patients or to those with increased risk (genetic, family history, elevated tumor markers or other high-risk factors) of ovarian cancer. Reference: Radiology 2019 Nov; 293(2):359-371. Electronically Signed: By: Donzetta Kohut M.D. On: 01/10/2022 11:19    Assessment: Right ovarian cystic mass, 40 cm in largest dimension Pelvic abdominal pain  Plan: Surgical management, Dr Macon Large is aware and will investigate timing options  Lazaro Arms, MD 01/11/2022 8:41 AM   Oncoming Attending Addendum Patient met and history reviewed with the help of a Spanish interpreter. Patient with a symptomatic 40 cm cyst, surgical intervention needed. Talked to [facility] OR team, she is able to  be added on tomorrow (01/12/22) afternoon. MRI abdomen and pelvis ordered for further characterization of mass preoperatively, tumor markers also ordered. Will follow up results and manage accordingly. Discussed need for surgical management with patient.  Recommended laparotomy, removal of cyst and ovary.  The risks of surgery were discussed in detail with the patient including but not limited to: bleeding which may require transfusion or reoperation; infection which may require prolonged hospitalization or re-hospitalization and antibiotic therapy; injury to bowel, bladder, ureters and major vessels or other surrounding organs which may lead to other procedures; formation of adhesions; need for additional procedures including laparotomy or subsequent procedures secondary to intraoperative injury or abnormal pathology; thromboembolic phenomenon; incisional problems and other postoperative or anesthesia complications.  Patient was told that the  likelihood that her condition and symptoms will be treated effectively with this surgical management was very high; the postoperative expectations were also discussed in detail.  Written informed consent obtained.  Routine admission orders placed for now, will be NPO after midnight. Routine floor care for now.    Jaynie Collins, MD, FACOG Obstetrician & Gynecologist, [facility] for [employer], [facility]

## 2022-01-11 NOTE — ED Provider Notes (Addendum)
?[facility] ?Provider Note ? ? ?CSN: [id number] ?Arrival date & time: 01/10/22  2002 ? ?  ? ?History ? ?Chief Complaint  ?Patient presents with  ? Abdominal Pain  ? ? ?Kathleen Cline is a 41 y.o. female with history of ovarian cysts on the right ovary in the past, GERD, and obesity who presents for second ED visit in 24 hours for worsening pain.  Patient was seen at [facility] On 4/30 in the morning for right-sided pain in the pelvis for the last 2 weeks progressively worsening since her last menstrual cycle.  Was found during that admission to have a significant right ovarian cyst measuring 40 x 11 x 14 cm; due to displacement of the ovary posterior to the uterus, unable to rule out torsion. ? ?She returns to the emergency department due to worsening pain despite Tylenol, Motrin, and reported prescribed pain medication in the ED. ? ?I have personally reviewed this patient's medical records. She has history of hyperlipidemia and GERD.  ? ?HPI ? ?  ? ?Home Medications ?Prior to Admission medications   ?Medication Sig Start Date End Date Taking? Authorizing Provider  ?acetaminophen (TYLENOL) 500 MG tablet Take 1 tablet (500 mg total) by mouth every 6 (six) hours as needed. ?Patient taking differently: Take 1,000 mg by mouth every 6 (six) hours as needed for moderate pain or headache. 01/10/22  Yes Redwine, Madison A, PA-C  ?ibuprofen (ADVIL) 600 MG tablet Take 1 tablet (600 mg total) by mouth every 6 (six) hours as needed. 01/10/22  Yes Redwine, Madison A, PA-C  ?lovastatin (MEVACOR) 40 MG tablet Take 1 tablet (40 mg total) by mouth at bedtime. ?Patient not taking: Reported on 06/23/2018 11/18/17   Clent Demark, PA-C  ?metroNIDAZOLE (FLAGYL) 500 MG tablet Take 1 tablet (500 mg total) by mouth 2 (two) times daily. 01/10/22   Redwine, Madison A, PA-C  ?omeprazole (PRILOSEC) 20 MG capsule Take 1 capsule (20 mg total) by mouth daily. ?Patient not taking:  Reported on 01/11/2022 11/08/18   Kerin Perna, NP  ?   ? ?Allergies    ?Eggs or egg-derived products   ? ?Review of Systems   ?Review of Systems  ?Gastrointestinal:  Positive for abdominal pain.  ?Genitourinary:  Negative for vaginal bleeding, vaginal discharge and vaginal pain.  ? ?Physical Exam ?Updated Vital Signs ?BP 132/68 (BP Location: Left Arm)   Pulse 75   Temp 98.9 ?F (37.2 ?C) (Oral)   Resp 16   Ht 5' (1.524 m)   Wt 106.6 kg   LMP 01/03/2022 (Approximate)   SpO2 99%   BMI 45.90 kg/m?  ?Physical Exam ?Vitals and nursing note reviewed.  ?Constitutional:   ?   Appearance: She is obese. She is not toxic-appearing.  ?HENT:  ?   Head: Normocephalic and atraumatic.  ?   Mouth/Throat:  ?   Mouth: Mucous membranes are moist.  ?   Pharynx: No oropharyngeal exudate or posterior oropharyngeal erythema.  ?Eyes:  ?   General:     ?   Right eye: No discharge.     ?   Left eye: No discharge.  ?   Conjunctiva/sclera: Conjunctivae normal.  ?Cardiovascular:  ?   Rate and Rhythm: Normal rate and regular rhythm.  ?   Pulses: Normal pulses.  ?   Heart sounds: Normal heart sounds. No murmur heard. ?Pulmonary:  ?   Effort: Pulmonary effort is normal. No respiratory distress.  ?  Breath sounds: Normal breath sounds. No wheezing or rales.  ?Abdominal:  ?   General: Bowel sounds are normal. There is no distension.  ?   Palpations: Abdomen is soft.  ?   Tenderness: There is abdominal tenderness in the right lower quadrant and suprapubic area.  ?Musculoskeletal:     ?   General: No deformity.  ?   Cervical back: Neck supple.  ?Skin: ?   General: Skin is warm and dry.  ?   Capillary Refill: Capillary refill takes less than 2 seconds.  ?Neurological:  ?   General: No focal deficit present.  ?   Mental Status: She is alert. Mental status is at baseline.  ?Psychiatric:     ?   Mood and Affect: Mood normal.  ? ? ?ED Results / Procedures / Treatments   ?Labs ?(all labs ordered are listed, but only abnormal results are  displayed) ?Labs Reviewed  ?COMPREHENSIVE METABOLIC PANEL - Abnormal; Notable for the following components:  ?    Result Value  ? Potassium 3.4 (*)   ? Glucose, Bld 102 (*)   ? All other components within normal limits  ?CBC WITH DIFFERENTIAL/PLATELET - Abnormal; Notable for the following components:  ? Hemoglobin 11.8 (*)   ? All other components within normal limits  ?RESP PANEL BY RT-PCR (FLU A&B, COVID) ARPGX2  ?LIPASE, BLOOD  ?URINALYSIS, ROUTINE W REFLEX MICROSCOPIC  ?I-STAT BETA HCG BLOOD, ED ([facility], [facility], [facility] ONLY)  ? ? ?EKG ?None ? ?Radiology ?US Transvaginal Non-OB ? ?Addendum Date: 01/10/2022   ?ADDENDUM REPORT: 01/10/2022 11:49 ADDENDUM: Given that low resistance flow could not be documented in the RIGHT ovary and there is a large ovarian cystic could serve as a nidus for torsion, if there is ongoing concern for ovarian torsion MRI should be considered for further evaluation. Doppler assessment is of uncertain significance given that the RIGHT ovary could not be assessed endovaginally. As stated previously MRI may be helpful to exclude the possibility of torsion. These results were called by telephone at the time of interpretation on 01/10/2022 at 11:49 am to provider MADISON REDWINE , who verbally acknowledged these results. Electronically Signed   By: Zetta Bills M.D.   On: 01/10/2022 11:49  ? ?Result Date: 01/10/2022 ?CLINICAL DATA:  RIGHT lower quadrant pain with known cyst. EXAM: TRANSABDOMINAL AND TRANSVAGINAL ULTRASOUND OF PELVIS DOPPLER ULTRASOUND OF OVARIES TECHNIQUE: Both transabdominal and transvaginal ultrasound examinations of the pelvis were performed. Transabdominal technique was performed for global imaging of the pelvis including uterus, ovaries, adnexal regions, and pelvic cul-de-sac. It was necessary to proceed with endovaginal exam following the transabdominal exam to visualize the ovaries particularly LEFT ovary and endometrium. Color and duplex Doppler ultrasound was utilized to evaluate  blood flow to the ovaries. COMPARISON:  None. FINDINGS: Uterus Measurements: 10.4 x 6.5 x 7.4 cm = volume: 264 mL. No fibroids or other mass visualized. Endometrium Thickness: 10.0 mm.  No focal abnormality visualized. Right ovary Measurements: 16.2 x 13.8 x 17.1 cm = volume: 2001 mL. Cystic area measuring 40.7 x 11.6 x 14.6 cm is increased in size as compared to the 2019 evaluation where it measured approximately 8 x 9 x 8 cm. Left ovary Measurements: 2.4 x 1.9 x 1.6 cm = volume: 3.7 mL. Normal appearance/no adnexal mass. Pulsed Doppler evaluation of both ovaries demonstrates normal low resistance waveforms on both arterial and venous phase are present on the LEFT. These were obtainable via endovaginal sonography. Waveforms cannot be confirmed on the RIGHT with  low resistance pattern, potentially due to technical factors though unclear on the current study. Waveforms were acquired via transabdominal approach only based on ovarian position on the RIGHT-hand side. Other findings No free fluid. IMPRESSION: Large RIGHT ovarian cyst has increased in size, based on size cannot be fully assessed. In the setting of current symptoms given the presence of this large cyst, inability to document low resistance flow, this study is unable to exclude the possibility of ovarian torsion on the RIGHT. MRI may be helpful for further evaluation. Ultimately gyn consultation may be warranted. This cyst is posterior to the uterus and this may be due to enlargement or abnormal position of the ovary. Abnormal position of the ovary is in other finding that can be seen in the setting of torsion. Internal characteristics of this cystic area are not assessed. Recommend GYN consult and consider pelvis MRI wo and w contrast if clinically warranted. As above. Note: This recommendation does not apply to premenarchal patients or to those with increased risk (genetic, family history, elevated tumor markers or other high-risk factors) of ovarian  cancer. Reference: Radiology 2019 Nov; 293(2):359-371. Electronically Signed: By: Zetta Bills M.D. On: 01/10/2022 11:19  ? ?US Pelvis Complete ? ?Addendum Date: 01/10/2022   ?ADDENDUM REPORT: 01/10/2022 11:49 ADDENDUM:

## 2022-01-12 ENCOUNTER — Encounter (HOSPITAL_COMMUNITY)
Admission: EM | Disposition: A | Discharge status: Home / Self Care | Payer: Self-pay | Source: Home / Self Care | Attending: Emergency Medicine

## 2022-01-12 ENCOUNTER — Observation Stay (HOSPITAL_COMMUNITY): Payer: Self-pay | Admitting: Certified Registered Nurse Anesthetist

## 2022-01-12 ENCOUNTER — Observation Stay (HOSPITAL_BASED_OUTPATIENT_CLINIC_OR_DEPARTMENT_OTHER): Payer: Self-pay | Admitting: Certified Registered Nurse Anesthetist

## 2022-01-12 ENCOUNTER — Other Ambulatory Visit: Payer: Self-pay

## 2022-01-12 ENCOUNTER — Encounter (HOSPITAL_COMMUNITY): Payer: Self-pay | Admitting: Obstetrics & Gynecology

## 2022-01-12 DIAGNOSIS — N83209 Unspecified ovarian cyst, unspecified side: Secondary | ICD-10-CM

## 2022-01-12 DIAGNOSIS — N838 Other noninflammatory disorders of ovary, fallopian tube and broad ligament: Secondary | ICD-10-CM

## 2022-01-12 DIAGNOSIS — Z0289 Encounter for other administrative examinations: Secondary | ICD-10-CM

## 2022-01-12 DIAGNOSIS — N83291 Other ovarian cyst, right side: Secondary | ICD-10-CM

## 2022-01-12 HISTORY — PX: LAPAROTOMY: SHX154

## 2022-01-12 HISTORY — PX: OVARIAN CYST REMOVAL: SHX89

## 2022-01-12 LAB — COMPREHENSIVE METABOLIC PANEL
ALT: 17 U/L (ref 0–44)
AST: 15 U/L (ref 15–41)
Albumin: 3.3 g/dL — ABNORMAL LOW (ref 3.5–5.0)
Alkaline Phosphatase: 46 U/L (ref 38–126)
Anion gap: 7 (ref 5–15)
BUN: 9 mg/dL (ref 6–20)
CO2: 27 mmol/L (ref 22–32)
Calcium: 8.4 mg/dL — ABNORMAL LOW (ref 8.9–10.3)
Chloride: 102 mmol/L (ref 98–111)
Creatinine, Ser: 0.7 mg/dL (ref 0.44–1.00)
GFR, Estimated: >60 mL/min (ref >60)
Glucose, Bld: 112 mg/dL — ABNORMAL HIGH (ref 70–99)
Potassium: 3.6 mmol/L (ref 3.5–5.1)
Sodium: 136 mmol/L (ref 135–145)
Total Bilirubin: 0.5 mg/dL (ref 0.3–1.2)
Total Protein: 6.7 g/dL (ref 6.5–8.1)

## 2022-01-12 LAB — CBC
HCT: 34.3 % — ABNORMAL LOW (ref 36.0–46.0)
Hemoglobin: 11 g/dL — ABNORMAL LOW (ref 12.0–15.0)
MCH: 27.6 pg (ref 26.0–34.0)
MCHC: 32.1 g/dL (ref 30.0–36.0)
MCV: 86 fL (ref 80.0–100.0)
Platelets: 169 10*3/uL (ref 150–400)
RBC: 3.99 MIL/uL (ref 3.87–5.11)
RDW: 13.6 % (ref 11.5–15.5)
WBC: 6.8 10*3/uL (ref 4.0–10.5)
nRBC: 0 % (ref 0.0–0.2)

## 2022-01-12 LAB — CEA: CEA: 1.2 ng/mL (ref 0.0–4.7)

## 2022-01-12 LAB — AFP TUMOR MARKER: AFP, Serum, Tumor Marker: 2.8 ng/mL (ref 0.0–6.4)

## 2022-01-12 LAB — TYPE AND SCREEN
ABO/RH(D): A POS
Antibody Screen: NEGATIVE

## 2022-01-12 LAB — GC/CHLAMYDIA PROBE AMP (~~LOC~~) NOT AT ARMC
Chlamydia: NEGATIVE
Comment: NEGATIVE
Comment: NORMAL
Neisseria Gonorrhea: NEGATIVE

## 2022-01-12 LAB — CANCER ANTIGEN 19-9: CA 19-9: 4 U/mL (ref 0–35)

## 2022-01-12 LAB — CA 125: Cancer Antigen (CA) 125: 12.2 U/mL (ref 0.0–38.1)

## 2022-01-12 SURGERY — LAPAROTOMY
Anesthesia: General

## 2022-01-12 MED ORDER — PANTOPRAZOLE SODIUM 40 MG PO TBEC
40.0000 mg | DELAYED_RELEASE_TABLET | Freq: Every day | ORAL | Status: DC
  Administered 2022-01-12: 40 mg via ORAL
  Filled 2022-01-12: qty 1

## 2022-01-12 MED ORDER — ROCURONIUM BROMIDE 10 MG/ML (PF) SYRINGE
PREFILLED_SYRINGE | INTRAVENOUS | Status: DC | PRN
  Administered 2022-01-12: 60 mg via INTRAVENOUS
  Administered 2022-01-12: 40 mg via INTRAVENOUS

## 2022-01-12 MED ORDER — ACETAMINOPHEN 500 MG PO TABS
1000.0000 mg | ORAL_TABLET | Freq: Four times a day (QID) | ORAL | Status: DC
  Administered 2022-01-13 (×2): 1000 mg via ORAL
  Filled 2022-01-12 (×2): qty 2

## 2022-01-12 MED ORDER — FENTANYL CITRATE (PF) 100 MCG/2ML IJ SOLN
INTRAMUSCULAR | Status: AC
Start: 2022-01-12 — End: 2022-01-13
  Filled 2022-01-12: qty 2

## 2022-01-12 MED ORDER — POVIDONE-IODINE 10 % EX SWAB: 2.0000 "application " | Freq: Once | CUTANEOUS | Status: DC

## 2022-01-12 MED ORDER — FENTANYL CITRATE (PF) 100 MCG/2ML IJ SOLN
25.0000 ug | INTRAMUSCULAR | Status: DC | PRN
  Administered 2022-01-12: 50 ug via INTRAVENOUS

## 2022-01-12 MED ORDER — SODIUM CHLORIDE 0.9 % IV SOLN: 2.0000 g | INTRAVENOUS | Status: DC

## 2022-01-12 MED ORDER — ESMOLOL HCL 100 MG/10ML IV SOLN
INTRAVENOUS | Status: AC
  Filled 2022-01-12: qty 10

## 2022-01-12 MED ORDER — BUPIVACAINE HCL (PF) 0.5 % IJ SOLN
INTRAMUSCULAR | Status: AC
  Filled 2022-01-12: qty 30

## 2022-01-12 MED ORDER — MENTHOL 3 MG MT LOZG: 1.0000 | LOZENGE | OROMUCOSAL | Status: DC | PRN

## 2022-01-12 MED ORDER — ACETAMINOPHEN 10 MG/ML IV SOLN
INTRAVENOUS | Status: AC
  Filled 2022-01-12: qty 100

## 2022-01-12 MED ORDER — MIDAZOLAM HCL 2 MG/2ML IJ SOLN
INTRAMUSCULAR | Status: AC
  Filled 2022-01-12: qty 2

## 2022-01-12 MED ORDER — PROPOFOL 1000 MG/100ML IV EMUL
INTRAVENOUS | Status: AC
  Filled 2022-01-12: qty 100

## 2022-01-12 MED ORDER — LACTATED RINGERS IV SOLN: INTRAVENOUS | Status: DC

## 2022-01-12 MED ORDER — IBUPROFEN 600 MG PO TABS: 600.0000 mg | ORAL_TABLET | Freq: Four times a day (QID) | ORAL | Status: DC

## 2022-01-12 MED ORDER — PROPOFOL 10 MG/ML IV BOLUS
INTRAVENOUS | Status: DC | PRN
  Administered 2022-01-12 (×2): 20 mg via INTRAVENOUS
  Administered 2022-01-12: 30 mg via INTRAVENOUS
  Administered 2022-01-12: 150 mg via INTRAVENOUS

## 2022-01-12 MED ORDER — BUPIVACAINE HCL (PF) 0.5 % IJ SOLN
INTRAMUSCULAR | Status: DC | PRN
  Administered 2022-01-12: 20 mL

## 2022-01-12 MED ORDER — DEXTROSE IN LACTATED RINGERS 5 % IV SOLN: INTRAVENOUS | Status: DC

## 2022-01-12 MED ORDER — GABAPENTIN 300 MG PO CAPS
300.0000 mg | ORAL_CAPSULE | Freq: Three times a day (TID) | ORAL | Status: DC
  Administered 2022-01-12 – 2022-01-13 (×2): 300 mg via ORAL
  Filled 2022-01-12 (×2): qty 1

## 2022-01-12 MED ORDER — SUGAMMADEX SODIUM 200 MG/2ML IV SOLN
INTRAVENOUS | Status: DC | PRN
  Administered 2022-01-12: 200 mg via INTRAVENOUS

## 2022-01-12 MED ORDER — CHLORHEXIDINE GLUCONATE 0.12 % MT SOLN
15.0000 mL | OROMUCOSAL | Status: DC
  Filled 2022-01-12 (×2): qty 15

## 2022-01-12 MED ORDER — KETOROLAC TROMETHAMINE 30 MG/ML IJ SOLN
INTRAMUSCULAR | Status: AC
  Filled 2022-01-12: qty 1

## 2022-01-12 MED ORDER — CEFAZOLIN SODIUM 1 G IJ SOLR
INTRAMUSCULAR | Status: AC
  Filled 2022-01-12: qty 30

## 2022-01-12 MED ORDER — DEXAMETHASONE SODIUM PHOSPHATE 10 MG/ML IJ SOLN
INTRAMUSCULAR | Status: DC | PRN
Start: 2022-01-12 — End: 2022-01-12
  Administered 2022-01-12: 10 mg via INTRAVENOUS

## 2022-01-12 MED ORDER — PROPOFOL 10 MG/ML IV BOLUS
INTRAVENOUS | Status: AC
  Filled 2022-01-12: qty 20

## 2022-01-12 MED ORDER — MIDAZOLAM HCL 5 MG/5ML IJ SOLN
INTRAMUSCULAR | Status: DC | PRN
Start: 2022-01-12 — End: 2022-01-12
  Administered 2022-01-12: 2 mg via INTRAVENOUS

## 2022-01-12 MED ORDER — 0.9 % SODIUM CHLORIDE (POUR BTL) OPTIME
TOPICAL | Status: DC | PRN
  Administered 2022-01-12: 1000 mL

## 2022-01-12 MED ORDER — ONDANSETRON HCL 4 MG/2ML IJ SOLN
INTRAMUSCULAR | Status: AC
  Filled 2022-01-12: qty 2

## 2022-01-12 MED ORDER — KETOROLAC TROMETHAMINE 30 MG/ML IJ SOLN
INTRAMUSCULAR | Status: DC | PRN
  Administered 2022-01-12: 30 mg via INTRAVENOUS

## 2022-01-12 MED ORDER — SIMETHICONE 80 MG PO CHEW: 80.0000 mg | CHEWABLE_TABLET | Freq: Four times a day (QID) | ORAL | Status: DC | PRN

## 2022-01-12 MED ORDER — ACETAMINOPHEN 10 MG/ML IV SOLN
INTRAVENOUS | Status: DC | PRN
  Administered 2022-01-12: 1000 mg via INTRAVENOUS

## 2022-01-12 MED ORDER — FENTANYL CITRATE (PF) 250 MCG/5ML IJ SOLN
INTRAMUSCULAR | Status: DC | PRN
  Administered 2022-01-12 (×2): 50 ug via INTRAVENOUS
  Administered 2022-01-12: 25 ug via INTRAVENOUS
  Administered 2022-01-12: 50 ug via INTRAVENOUS
  Administered 2022-01-12: 25 ug via INTRAVENOUS
  Administered 2022-01-12: 50 ug via INTRAVENOUS

## 2022-01-12 MED ORDER — CHLORHEXIDINE GLUCONATE 0.12 % MT SOLN
15.0000 mL | OROMUCOSAL | Status: AC
  Administered 2022-01-12: 15 mL via OROMUCOSAL

## 2022-01-12 MED ORDER — LIDOCAINE 2% (20 MG/ML) 5 ML SYRINGE
INTRAMUSCULAR | Status: AC
  Filled 2022-01-12: qty 5

## 2022-01-12 MED ORDER — OXYCODONE HCL 5 MG PO TABS: 5.0000 mg | ORAL_TABLET | ORAL | Status: DC | PRN

## 2022-01-12 MED ORDER — LIDOCAINE 2% (20 MG/ML) 5 ML SYRINGE
INTRAMUSCULAR | Status: DC | PRN
Start: 2022-01-12 — End: 2022-01-12
  Administered 2022-01-12: 60 mg via INTRAVENOUS

## 2022-01-12 MED ORDER — CEFAZOLIN SODIUM 10 G IJ SOLR
INTRAMUSCULAR | Status: DC | PRN
  Administered 2022-01-12: 3 g via INTRAVENOUS

## 2022-01-12 MED ORDER — ROCURONIUM BROMIDE 10 MG/ML (PF) SYRINGE
PREFILLED_SYRINGE | INTRAVENOUS | Status: AC
  Filled 2022-01-12: qty 10

## 2022-01-12 MED ORDER — DEXAMETHASONE SODIUM PHOSPHATE 10 MG/ML IJ SOLN
INTRAMUSCULAR | Status: AC
  Filled 2022-01-12: qty 1

## 2022-01-12 MED ORDER — KETOROLAC TROMETHAMINE 30 MG/ML IJ SOLN
30.0000 mg | Freq: Four times a day (QID) | INTRAMUSCULAR | Status: DC
  Administered 2022-01-13 (×2): 30 mg via INTRAVENOUS
  Filled 2022-01-12 (×2): qty 1

## 2022-01-12 MED ORDER — BISACODYL 10 MG RE SUPP
10.0000 mg | Freq: Every day | RECTAL | Status: DC | PRN
Start: 2022-01-12 — End: 2022-01-13

## 2022-01-12 MED ORDER — FENTANYL CITRATE (PF) 250 MCG/5ML IJ SOLN
INTRAMUSCULAR | Status: AC
  Filled 2022-01-12: qty 5

## 2022-01-12 MED ORDER — DEXMEDETOMIDINE (PRECEDEX) IN NS 20 MCG/5ML (4 MCG/ML) IV SYRINGE
PREFILLED_SYRINGE | INTRAVENOUS | Status: AC
  Filled 2022-01-12: qty 5

## 2022-01-12 SURGICAL SUPPLY — 45 items
APL SKNCLS STERI-STRIP NONHPOA (GAUZE/BANDAGES/DRESSINGS)
BAG COUNTER SPONGE SURGICOUNT (BAG) ×2 IMPLANT
BAG SPNG CNTER NS LX DISP (BAG) ×1
BENZOIN TINCTURE PRP APPL 2/3 (GAUZE/BANDAGES/DRESSINGS) ×1 IMPLANT
CANISTER SUCT 3000ML PPV (MISCELLANEOUS) ×2 IMPLANT
CELLS DAT CNTRL 66122 CELL SVR (MISCELLANEOUS) ×1 IMPLANT
DECANTER SPIKE VIAL GLASS SM (MISCELLANEOUS) IMPLANT
DRAPE WARM FLUID 44X44 (DRAPES) IMPLANT
DRSG OPSITE POSTOP 4X10 (GAUZE/BANDAGES/DRESSINGS) ×1 IMPLANT
DRSG OPSITE POSTOP 4X6 (GAUZE/BANDAGES/DRESSINGS) ×1 IMPLANT
DURAPREP 26ML APPLICATOR (WOUND CARE) ×3 IMPLANT
ELECT BLADE 4.0 EZ CLEAN MEGAD (MISCELLANEOUS) ×2
ELECTRODE BLDE 4.0 EZ CLN MEGD (MISCELLANEOUS) IMPLANT
GAUZE 4X4 16PLY ~~LOC~~+RFID DBL (SPONGE) ×2 IMPLANT
GAUZE SPONGE 4X4 12PLY STRL LF (GAUZE/BANDAGES/DRESSINGS) ×1 IMPLANT
GLOVE BIOGEL PI IND STRL 7.0 (GLOVE) ×3 IMPLANT
GLOVE BIOGEL PI INDICATOR 7.0 (GLOVE) ×3
GLOVE ECLIPSE 7.0 STRL STRAW (GLOVE) ×2 IMPLANT
GOWN STRL REUS W/ TWL LRG LVL3 (GOWN DISPOSABLE) ×3 IMPLANT
GOWN STRL REUS W/TWL LRG LVL3 (GOWN DISPOSABLE) ×4
KIT TURNOVER KIT B (KITS) ×2 IMPLANT
NEEDLE HYPO 22GX1.5 SAFETY (NEEDLE) ×2 IMPLANT
NS IRRIG 1000ML POUR BTL (IV SOLUTION) ×2 IMPLANT
PACK ABDOMINAL GYN (CUSTOM PROCEDURE TRAY) ×2 IMPLANT
PAD OB MATERNITY 4.3X12.25 (PERSONAL CARE ITEMS) ×2 IMPLANT
RETRACTOR WND ALEXIS 18 MED (MISCELLANEOUS) IMPLANT
RTRCTR WOUND ALEXIS 18CM MED (MISCELLANEOUS) ×2
SPECIMEN JAR MEDIUM (MISCELLANEOUS) ×3 IMPLANT
SPONGE T-LAP 18X18 ~~LOC~~+RFID (SPONGE) ×4 IMPLANT
STRIP CLOSURE SKIN 1/2X4 (GAUZE/BANDAGES/DRESSINGS) ×1 IMPLANT
SUT MNCRL AB 3-0 PS2 27 (SUTURE) ×1 IMPLANT
SUT PDS AB 0 CTX 60 (SUTURE) IMPLANT
SUT PLAIN 2 0 (SUTURE)
SUT PLAIN ABS 2-0 CT1 27XMFL (SUTURE) IMPLANT
SUT VIC AB 0 CT1 18XCR BRD8 (SUTURE) IMPLANT
SUT VIC AB 0 CT1 27 (SUTURE) ×4
SUT VIC AB 0 CT1 27XBRD ANBCTR (SUTURE) ×1 IMPLANT
SUT VIC AB 0 CT1 8-18 (SUTURE) ×4
SUT VIC AB 0 CTX 36 (SUTURE)
SUT VIC AB 0 CTX36XBRD ANBCTRL (SUTURE) IMPLANT
SUT VIC AB 4-0 KS 27 (SUTURE) ×1 IMPLANT
SUT VICRYL 0 TIES 12 18 (SUTURE) IMPLANT
SYR CONTROL 10ML LL (SYRINGE) ×2 IMPLANT
TOWEL GREEN STERILE FF (TOWEL DISPOSABLE) ×4 IMPLANT
TRAY FOLEY W/BAG SLVR 14FR (SET/KITS/TRAYS/PACK) ×2 IMPLANT

## 2022-01-12 NOTE — Plan of Care (Signed)

## 2022-01-12 NOTE — Op Note (Signed)
PROCEDURE DATE:01/12/2022 ? ?PREOPERATIVE DIAGNOSIS:  Symptomatic large right adnexal cyst ?POSTOPERATIVE DIAGNOSIS:  The same ?SURGEON:   Verita Schneiders, M.D. ?ANESTHESIOLOGY TEAM: Anesthesiologist: Darral Dash, DO ?CRNA: Carolan Clines, CRNA ?Student Nurse Anesthetist: Justin Mend, RN ?OPERATION: Mini-laparotomy, right ovarian cystectomy ?ANESTHESIA:  General endotracheal. ? ?INDICATIONS: The patient is a 41 y.o. [id number] with symptomatic large adnexal cyst who desired definitive surgical management. On the preoperative visit, the risks, benefits, indications, and alternatives of the procedure were reviewed with the patient.  On the day of surgery, the risks of surgery were again discussed with the patient including but not limited to: bleeding which may require transfusion or reoperation; infection which may require antibiotics; injury to bowel, bladder, ureters or other surrounding organs; need for additional procedures; thromboembolic phenomenon, incisional problems and other postoperative/anesthesia complications. Written informed consent was obtained.   ? ?OPERATIVE FINDINGS: 16 cm right adnexal mass exophytic but connected to normal appearing right ovary.  Aspirated for over 1500 ml of clear fluid prior to removal of cyst.  Small uterus with normal left fallopian tube and ovary.   No other anomalies noted in pelvis. Patient's morbid obesity did make the surgery and visualization very difficult. ? ?ESTIMATED BLOOD LOSS: 50 ml ?SPECIMENS:  Right adnexal cyst sent to pathology ?COMPLICATIONS:  None immediate. ? ?DESCRIPTION OF PROCEDURE:  The patient received intravenous antibiotics and had sequential compression devices applied to her lower extremities while in the preoperative area.   She was taken to the operating room and placed under general anesthesia without difficulty.The abdomen and perineum were prepped and draped in a sterile manner, and she was placed in a dorsal supine position.  A  Foley catheter was inserted into the bladder and attached to constant drainage. After an adequate timeout was performed, an infraumbilical two inch skin incision was made. This incision was taken down to the fascia using electrocautery with care given to maintain good hemostasis, there was about 6 cm depth of subcutaneous tissue prior to encountering the fascia. The fascia was incised in the midline and the fascial incision was then extended bilaterally using electrocautery and sharp methods with some difficulty due to habitus. The rectus muscles were split bluntly in the midline and the peritoneum entered sharply without complication. This peritoneal incision was then extended superiorly and inferiorly with care given to prevent bowel injury.  Attention was then turned to the pelvis. The bowel was packed away with moist laparotomy sponges and the small Alexis retractor was placed. The large adnexal cyst was immediately noted and occupied the entire pelvis and lower abdomen. A small incision was made into it and the cyst was drained for over 1500 ml of clear fluid.  After this aspiration, the deflated cyst was noted to be connected to the right ovary; the main part of the ovary looked normal. At this point, it was uncertain if the cyst was truly ovarian or paraovarian, and it also seemed to involve the fallopian tube which was not  clearly noted.  The base of the cyst was clamped with Kelly forceps from both sides, and excised. This pedicle on the outside of the right ovary was doubly suture ligated with 0 Vicryl, and was hemostatic.   The left adnexa and uterus were noted to be normal.  All laparotomy sponges, Alexis retractor and instruments were removed from the abdomen. The fascia was closed in a running fashion with 0 PDS suture. The subcutaneous layer was irrigated and injected with 20 ml of 0.5% Marcaine.  The subcutaneous layer was re-approximated with 2-0 plain gut in layers.  The skin was closed with a 4-0  Vicryl subcuticular stitch. Sponge, lap, needle, and instrument counts were correct times three. The patient was taken to the recovery area awake, extubated and in stable condition. ? ?The patient will be observed overnight in the hospital, with plans for disposition in the next day if she remains stable. ? ? ?Verita Schneiders, MD, FACOG ?Attending Obstetrician & Gynecologist ?[facility], [place] ? ?

## 2022-01-12 NOTE — Transfer of Care (Signed)
Immediate Anesthesia Transfer of Care Note ? ?Patient: Kathleen Cline ? ?Procedure(s) Performed: LAPAROTOMY ?RIGHT OVARIAN CYSTECTOMY ? ?Patient Location: PACU ? ?Anesthesia Type:General ? ?Level of Consciousness: awake, alert  and oriented ? ?Airway & Oxygen Therapy: Patient Spontanous Breathing ? ?Post-op Assessment: Report given to RN and Post -op Vital signs reviewed and stable ? ?Post vital signs: Reviewed and stable ? ?Last Vitals:  ?Vitals Value Taken Time  ?BP 168/88 01/12/22 1659  ?Temp    ?Pulse 79 01/12/22 1700  ?Resp 13 01/12/22 1700  ?SpO2 95 % 01/12/22 1700  ?Vitals shown include unvalidated device data. ? ?Last Pain:  ?Vitals:  ? 01/12/22 1412  ?TempSrc:   ?PainSc: 0-No pain  ?   ? ?  ? ?Complications: No notable events documented. ?

## 2022-01-12 NOTE — Anesthesia Preprocedure Evaluation (Addendum)
Anesthesia Evaluation  ?Patient identified by MRN, date of birth, ID band ?Patient awake ? ? ? ?Reviewed: ?Allergy & Precautions, NPO status , Patient's Chart, lab work & pertinent test results ? ?Airway ?Mallampati: II ? ?TM Distance: >3 FB ?Neck ROM: Full ? ? ? Dental ?no notable dental hx. ? ?  ?Pulmonary ?neg pulmonary ROS,  ?  ?Pulmonary exam normal ? ? ? ? ? ? ? Cardiovascular ?negative cardio ROS ? ? ?Rhythm:Regular Rate:Normal ? ? ?  ?Neuro/Psych ?negative neurological ROS ? negative psych ROS  ? GI/Hepatic ?Neg liver ROS, GERD  Medicated,  ?Endo/Other  ?negative endocrine ROS ? Renal/GU ?negative Renal ROS  ?Female GU complaint ?Ovarian cyst ? ?  ?Musculoskeletal ?negative musculoskeletal ROS ?(+)  ? Abdominal ?Normal abdominal exam  (+)   ?Peds ? Hematology ?negative hematology ROS ?(+)   ?Anesthesia Other Findings ? ? Reproductive/Obstetrics ? ?  ? ? ? ? ? ? ? ? ? ? ? ? ? ?  ?  ? ? ? ? ? ? ? ?Anesthesia Physical ?Anesthesia Plan ? ?ASA: 2 ? ?Anesthesia Plan: General  ? ?Post-op Pain Management:   ? ?Induction: Intravenous ? ?PONV Risk Score and Plan: 3 and Ondansetron, Dexamethasone, Midazolam and Treatment may vary due to age or medical condition ? ?Airway Management Planned: Mask and Oral ETT ? ?Additional Equipment: None ? ?Intra-op Plan:  ? ?Post-operative Plan: Extubation in OR ? ?Informed Consent: I have reviewed the patients History and Physical, chart, labs and discussed the procedure including the risks, benefits and alternatives for the proposed anesthesia with the patient or authorized representative who has indicated his/her understanding and acceptance.  ? ? ? ?Dental advisory given ? ?Plan Discussed with: CRNA ? ?Anesthesia Plan Comments: (Lab Results ?     Component                Value               Date                 ?     WBC                      6.8                 01/12/2022           ?     HGB                      11.0 (L)            01/12/2022            ?     HCT                      34.3 (L)            01/12/2022           ?     MCV                      86.0                01/12/2022           ?     PLT                      169  01/12/2022           ?Lab Results ?     Component                Value               Date                 ?     NA                       136                 01/12/2022           ?     K                        3.6                 01/12/2022           ?     CO2                      27                  01/12/2022           ?     GLUCOSE                  112 (H)             01/12/2022           ?     BUN                      9                   01/12/2022           ?     CREATININE               0.70                01/12/2022           ?     CALCIUM                  8.4 (L)             01/12/2022           ?     GFRNONAA                 >60                 01/12/2022          )  ? ? ? ? ? ? ?Anesthesia Quick Evaluation ? ?

## 2022-01-12 NOTE — Anesthesia Procedure Notes (Signed)
Procedure Name: Intubation ?Date/Time: 01/12/2022 3:29 PM ?Performed by: Justin Mend, RN ?Pre-anesthesia Checklist: Patient identified, Emergency Drugs available, Suction available and Patient being monitored ?Patient Re-evaluated:Patient Re-evaluated prior to induction ?Oxygen Delivery Method: Circle System Utilized ?Preoxygenation: Pre-oxygenation with 100% oxygen ?Induction Type: IV induction ?Ventilation: Mask ventilation without difficulty ?Laryngoscope Size: Sabra Heck and 3 ?Grade View: Grade II ?Tube type: Oral ?Number of attempts: 1 ?Airway Equipment and Method: Stylet and Oral airway ?Placement Confirmation: ETT inserted through vocal cords under direct vision, positive ETCO2 and breath sounds checked- equal and bilateral ?Secured at: 23 cm ?Tube secured with: Tape ?Dental Injury: Teeth and Oropharynx as per pre-operative assessment  ?Comments: Performed by Justin Mend, SRNA under direct supervision of MDA and CRNA. DL with Sabra Heck 2, unable to achieve adequate view of glottic opening. Successful view achieved with Sabra Heck 3. ? ? ? ? ?

## 2022-01-12 NOTE — Progress Notes (Signed)
? ? ?Water engineer OB/GYN Attending Note ? ?Subjective:  ?Patient is Spanish-speaking only, interpreter present for this encounter. ? ?Patient reported intermittent abdominal pain, controlled on medication. Husband at bedside. No nausea, no bleeding.  Has been NPO since midnight.  ? ?Admitted on 01/10/2022 for Ovarian cyst. ?   ?Objective:  ?Blood pressure (!) 151/80, pulse 72, temperature 98.1 ?F (36.7 ?C), temperature source Oral, resp. rate 18, height 5' (1.524 m), weight 106.6 kg, last menstrual period 01/03/2022, SpO2 99 %. ?Gen: NAD ?HENT: Normocephalic, atraumatic ?Lungs: Normal respiratory effort ?Heart: Regular rate noted ?Abdomen: obese, moderate TTP, no rebound or guarding, soft ?Cervix: Deferred ?Ext: 2+ DTRs, no edema, no cyanosis, negative Homan's sign ? ?Labs and Imaging: ? ? ?  Latest Ref Rng & Units 01/12/2022  ?  4:58 AM 01/11/2022  ?  2:30 AM 01/10/2022  ?  8:03 AM  ?CBC  ?WBC 4.0 - 10.5 K/uL 6.8   8.3   6.8    ?Hemoglobin 12.0 - 15.0 g/dL 11.0   11.8   11.6    ?Hematocrit 36.0 - 46.0 % 34.3   37.4   35.0    ?Platelets 150 - 400 K/uL 169   170   168    ? ? ?  Latest Ref Rng & Units 01/12/2022  ?  4:58 AM 01/11/2022  ?  2:30 AM 01/10/2022  ?  8:03 AM  ?CMP  ?Glucose 70 - 99 mg/dL 112   102   95    ?BUN 6 - 20 mg/dL '9   9   11    '$ ?Creatinine 0.44 - 1.00 mg/dL 0.70   0.52   0.50    ?Sodium 135 - 145 mmol/L 136   136   138    ?Potassium 3.5 - 5.1 mmol/L 3.6   3.4   3.4    ?Chloride 98 - 111 mmol/L 102   103   107    ?CO2 22 - 32 mmol/L '27   26   24    '$ ?Calcium 8.9 - 10.3 mg/dL 8.4   8.9   8.4    ?Total Protein 6.5 - 8.1 g/dL 6.7   7.1   7.5    ?Total Bilirubin 0.3 - 1.2 mg/dL 0.5   0.5   0.7    ?Alkaline Phos 38 - 126 U/L 46   58   56    ?AST 15 - 41 U/L '15   19   18    '$ ?ALT 0 - 44 U/L '17   18   19    '$ ? ? ?MR PELVIS W WO CONTRAST ? ?Result Date: 01/11/2022 ?CLINICAL DATA:  Adnexal mass EXAM: MRI ABDOMEN AND PELVIS WITHOUT AND WITH CONTRAST TECHNIQUE: Multiplanar multisequence MR imaging of the abdomen and  pelvis was performed both before and after the administration of intravenous contrast. CONTRAST:  13m GADAVIST GADOBUTROL 1 MMOL/ML IV SOLN COMPARISON:  Pelvic ultrasound 01/10/2022, MRI pelvis 03/21/2018 FINDINGS: COMBINED FINDINGS FOR BOTH MR ABDOMEN AND PELVIS Upper abdominal structures are beyond the field of view, this study focuses on the large adnexal/lower abdominal mass. Lower chest: Not visualized. Hepatobiliary: Not visualized. Pancreas:  Not visualized. Spleen:  Not visualized. Adrenals/Urinary Tract: Kidneys are partially visualized with no hydronephrosis identified. No mass in the lower poles. Urinary bladder and urethra appear normal. Stomach/Bowel: No evidence of bowel obstruction or bowel wall edema. Vascular/Lymphatic: No pathologically enlarged lymph nodes identified. No abdominal aortic aneurysm demonstrated. Reproductive: Uterus is anteverted and measures 6.7 x 7.4  x 11.8 cm. There is a submucosal fibroid or area of focal adenomyosis visualized at the anterior fundus which measures approximately 2 cm in size, similar to slightly decreased in size since previous MRI. There is also a 7 mm ovoid intracavitary fibroid or polyp identified at the lower uterine segment which is not visualized on previous MRI. Cervical stroma demonstrates normal hypointense T2 signal. Vaginal canal is grossly normal. Left ovary is normal size. 4.1 x 4 cm left ovarian mass which is heterogeneously mixed hyperintense and hypointense on T2 and mixed hypointense and isointense on T1, with no definite nodular enhancement appreciated, likely a hemorrhagic cyst. Right ovary measures 4 x 3.5 x 4.6 cm and contains several small follicles and a large exophytic simple cyst which extends into the lower abdomen measuring up to 11.3 x 15.8 x 15.9 cm in AP, transverse and craniocaudal dimensions, enlarged since previous MRI. No enhancing nodularity or thick septations. No significant surrounding soft tissue edema. Other:  No ascites.  Musculoskeletal: No suspicious bone lesions identified. IMPRESSION: 1. Large exophytic simple appearing right ovarian cyst as described, enlarged since previous MRI in 2019. Consider gynecologic surgical consultation given the size and risk for torsion. 2. Heterogeneous complex hemorrhagic cyst of the left ovary measuring up to 4.1 cm. Recommend follow-up ultrasound in 2-3 months. 3. Subcentimeter intracavitary fibroid or endometrial polyp identified at the lower uterine segment. 4. Approximately 2 cm submucosal fibroid or focal adenomyosis at the anterior fundus. Electronically Signed   By: Ofilia Neas M.D.   On: 01/11/2022 16:03  ? ?MR ABDOMEN W WO CONTRAST ? ?Result Date: 01/11/2022 ?CLINICAL DATA:  Adnexal mass EXAM: MRI ABDOMEN AND PELVIS WITHOUT AND WITH CONTRAST TECHNIQUE: Multiplanar multisequence MR imaging of the abdomen and pelvis was performed both before and after the administration of intravenous contrast. CONTRAST:  73m GADAVIST GADOBUTROL 1 MMOL/ML IV SOLN COMPARISON:  Pelvic ultrasound 01/10/2022, MRI pelvis 03/21/2018 FINDINGS: COMBINED FINDINGS FOR BOTH MR ABDOMEN AND PELVIS Upper abdominal structures are beyond the field of view, this study focuses on the large adnexal/lower abdominal mass. Lower chest: Not visualized. Hepatobiliary: Not visualized. Pancreas:  Not visualized. Spleen:  Not visualized. Adrenals/Urinary Tract: Kidneys are partially visualized with no hydronephrosis identified. No mass in the lower poles. Urinary bladder and urethra appear normal. Stomach/Bowel: No evidence of bowel obstruction or bowel wall edema. Vascular/Lymphatic: No pathologically enlarged lymph nodes identified. No abdominal aortic aneurysm demonstrated. Reproductive: Uterus is anteverted and measures 6.7 x 7.4 x 11.8 cm. There is a submucosal fibroid or area of focal adenomyosis visualized at the anterior fundus which measures approximately 2 cm in size, similar to slightly decreased in size since  previous MRI. There is also a 7 mm ovoid intracavitary fibroid or polyp identified at the lower uterine segment which is not visualized on previous MRI. Cervical stroma demonstrates normal hypointense T2 signal. Vaginal canal is grossly normal. Left ovary is normal size. 4.1 x 4 cm left ovarian mass which is heterogeneously mixed hyperintense and hypointense on T2 and mixed hypointense and isointense on T1, with no definite nodular enhancement appreciated, likely a hemorrhagic cyst. Right ovary measures 4 x 3.5 x 4.6 cm and contains several small follicles and a large exophytic simple cyst which extends into the lower abdomen measuring up to 11.3 x 15.8 x 15.9 cm in AP, transverse and craniocaudal dimensions, enlarged since previous MRI. No enhancing nodularity or thick septations. No significant surrounding soft tissue edema. Other:  No ascites. Musculoskeletal: No suspicious bone lesions identified. IMPRESSION: 1.  Large exophytic simple appearing right ovarian cyst as described, enlarged since previous MRI in 2019. Consider gynecologic surgical consultation given the size and risk for torsion. 2. Heterogeneous complex hemorrhagic cyst of the left ovary measuring up to 4.1 cm. Recommend follow-up ultrasound in 2-3 months. 3. Subcentimeter intracavitary fibroid or endometrial polyp identified at the lower uterine segment. 4. Approximately 2 cm submucosal fibroid or focal adenomyosis at the anterior fundus. Electronically Signed   By: Ofilia Neas M.D.   On: 01/11/2022 16:03  ? ?US Transvaginal Non-OB ? ?Addendum Date: 01/10/2022   ?ADDENDUM REPORT: 01/10/2022 11:49 ADDENDUM: Given that low resistance flow could not be documented in the RIGHT ovary and there is a large ovarian cystic could serve as a nidus for torsion, if there is ongoing concern for ovarian torsion MRI should be considered for further evaluation. Doppler assessment is of uncertain significance given that the RIGHT ovary could not be assessed  endovaginally. As stated previously MRI may be helpful to exclude the possibility of torsion. These results were called by telephone at the time of interpretation on 01/10/2022 at 11:49 am to provider MADISON REDWI

## 2022-01-13 ENCOUNTER — Encounter (HOSPITAL_COMMUNITY): Payer: Self-pay | Admitting: Obstetrics & Gynecology

## 2022-01-13 DIAGNOSIS — Z4889 Encounter for other specified surgical aftercare: Secondary | ICD-10-CM

## 2022-01-13 LAB — CBC
HCT: 35.9 % — ABNORMAL LOW (ref 36.0–46.0)
Hemoglobin: 11.6 g/dL — ABNORMAL LOW (ref 12.0–15.0)
MCH: 27.4 pg (ref 26.0–34.0)
MCHC: 32.3 g/dL (ref 30.0–36.0)
MCV: 84.7 fL (ref 80.0–100.0)
Platelets: 191 10*3/uL (ref 150–400)
RBC: 4.24 MIL/uL (ref 3.87–5.11)
RDW: 13.2 % (ref 11.5–15.5)
WBC: 9.9 10*3/uL (ref 4.0–10.5)
nRBC: 0 % (ref 0.0–0.2)

## 2022-01-13 MED ORDER — IBUPROFEN 600 MG PO TABS
600.0000 mg | ORAL_TABLET | Freq: Four times a day (QID) | ORAL | 2 refills | Status: AC | PRN
Start: 2022-01-13 — End: ?

## 2022-01-13 MED ORDER — DOCUSATE SODIUM 100 MG PO CAPS: 100.0000 mg | ORAL_CAPSULE | Freq: Two times a day (BID) | ORAL | 2 refills | Status: AC | PRN

## 2022-01-13 MED ORDER — OXYCODONE HCL 5 MG PO TABS: 5.0000 mg | ORAL_TABLET | ORAL | 0 refills | Status: AC | PRN

## 2022-01-13 NOTE — Discharge Summary (Signed)
Gynecology Physician Postoperative Discharge Summary  ?Patient ID: ?Rudie Meyer Nonato ?MRN: [id number] ?DOB/AGE: 10-24-1980 41 y.o. ? ?Admit Date: 01/10/2022 ?Discharge Date: 01/13/2022 ? ?Preoperative Diagnoses: Large symptomatic right adnexal cyst ? ?Procedures: Procedure(s) (LRB): ?MINI-LAPAROTOMY (N/A) ?RIGHT OVARIAN CYSTECTOMY (N/A) ? ?Hospital Course:  ?Bryon Lions Asencion Partridge Taeja Debellis is a 41 y.o. [id number] admitted to [facility] on 01/11/2022 after evaluation in ER on 01/10/22 for pain related to her large 16 cm right adnexal cyst.  She was treated with analgesia and surgery was scheduled on 01/12/2022.  She underwent the procedures as mentioned above, her operation was uncomplicated. For further details about surgery, please refer to the operative report. Patient had an uncomplicated postoperative course. By time of discharge on POD#1, her pain was controlled on oral pain medications; she was ambulating, voiding without difficulty, tolerating regular diet and passing flatus. She was deemed stable for discharge to home with outpatient follow up.  ? ?Significant Labs: ? ?  Latest Ref Rng & Units 01/13/2022  ?  4:19 AM 01/12/2022  ?  4:58 AM 01/11/2022  ?  2:30 AM  ?CBC  ?WBC 4.0 - 10.5 K/uL 9.9   6.8   8.3    ?Hemoglobin 12.0 - 15.0 g/dL 11.6   11.0   11.8    ?Hematocrit 36.0 - 46.0 % 35.9   34.3   37.4    ?Platelets 150 - 400 K/uL 191   169   170    ? ? ?Discharge Exam: ?Blood pressure 117/65, pulse 80, temperature 97.7 ?F (36.5 ?C), temperature source Oral, resp. rate 19, height 5' (1.524 m), weight 106 kg, last menstrual period 01/03/2022, SpO2 97 %. ?General appearance: alert and no distress  ?Resp: clear to auscultation bilaterally  ?Cardio: regular rate and rhythm  ?GI: soft, obese, mildly tender around incision; bowel sounds normal; no masses, no organomegaly.  ?Incision: C/D/I, no erythema, no drainage noted ?Pelvic: scant blood on pad (done in presence of RN as chaperone)  ?Extremities: extremities normal,  atraumatic, no cyanosis or edema and Homans sign is negative, no sign of DVT ? ?Discharged Condition: Stable ? ? Discharge disposition: 01-Home or Self Care ? ? ? ? ? ? ? ?Allergies as of 01/13/2022   ? ?   Reactions  ? Eggs Or Egg-derived Products Nausea And Vomiting  ? ?  ? ?  ?Medication List  ?  ? ?STOP taking these medications   ? ?lovastatin 40 MG tablet ?Commonly known as: MEVACOR ?  ?metroNIDAZOLE 500 MG tablet ?Commonly known as: FLAGYL ?  ?omeprazole 20 MG capsule ?Commonly known as: PRILOSEC ?  ? ?  ? ?TAKE these medications   ? ?acetaminophen 500 MG tablet ?Commonly known as: TYLENOL ?Take 1 tablet (500 mg total) by mouth every 6 (six) hours as needed. ?What changed:  ?how much to take ?reasons to take this ?  ?docusate sodium 100 MG capsule ?Commonly known as: COLACE ?Take 1 capsule (100 mg total) by mouth 2 (two) times daily as needed for mild constipation or moderate constipation. ?  ?ibuprofen 600 MG tablet ?Commonly known as: ADVIL ?Take 1 tablet (600 mg total) by mouth every 6 (six) hours as needed for moderate pain, mild pain or headache. ?What changed: reasons to take this ?  ?oxyCODONE 5 MG immediate release tablet ?Commonly known as: Oxy IR/ROXICODONE ?Take 1 tablet (5 mg total) by mouth every 4 (four) hours as needed for severe pain or breakthrough pain. ?  ? ?  ? ? ? Follow-up  Information   ? ? [facility] Follow up in 1 week(s).   ?Specialty: Obstetrics and Gynecology ?Why: You will be called with appointment date and time for postoperative appointment ?Contact information: ?[address] ?[place] [phone] ?[phone] ? ?  ?  ? ?  ?  ? ?  ? ? ?Total discharge time: 20 minutes  ? ?Signed: ? ?Verita Schneiders, MD, FACOG ?Attending Obstetrician & Gynecologist ?[facility], [place] ? ? ? ?

## 2022-01-13 NOTE — Anesthesia Postprocedure Evaluation (Signed)
Anesthesia Post Note ? ?Patient: Kathleen Cline ? ?Procedure(s) Performed: LAPAROTOMY ?RIGHT OVARIAN CYSTECTOMY ? ?  ? ?Patient location during evaluation: PACU ?Anesthesia Type: General ?Level of consciousness: awake and alert ?Pain management: pain level controlled ?Vital Signs Assessment: post-procedure vital signs reviewed and stable ?Respiratory status: spontaneous breathing, nonlabored ventilation, respiratory function stable and patient connected to nasal cannula oxygen ?Cardiovascular status: blood pressure returned to baseline and stable ?Postop Assessment: no apparent nausea or vomiting ?Anesthetic complications: no ? ? ?No notable events documented. ? ?Last Vitals:  ?Vitals:  ? 01/13/22 0540 01/13/22 0811  ?BP: (!) 145/73 117/65  ?Pulse: 83 80  ?Resp: 18 19  ?Temp: 36.7 ?C 36.5 ?C  ?SpO2: 98% 97%  ?  ?Last Pain:  ?Vitals:  ? 01/13/22 0915  ?TempSrc:   ?PainSc: 0-No pain  ? ? ?  ?  ?  ?  ?  ?  ? ?March Rummage Mckyla Deckman ? ? ? ? ?

## 2022-01-14 ENCOUNTER — Encounter: Payer: Self-pay | Admitting: Obstetrics & Gynecology

## 2022-01-14 ENCOUNTER — Telehealth: Payer: Self-pay

## 2022-01-14 LAB — SURGICAL PATHOLOGY

## 2022-01-14 NOTE — Telephone Encounter (Signed)
Transition Care Management Follow-up Telephone Call ?Date of discharge and from where: [facility] on 01/13/2022 ?How have you been since you were released from the hospital? Feeling good ?Any questions or concerns? No ? ?Items Reviewed: ?Did the pt receive and understand the discharge instructions provided? Yes  ?Medications obtained and verified? Yes  ?Other? No  ?Any new allergies since your discharge? No  ?Dietary orders reviewed? Yes ?Do you have support at home? Yes  ? ?Home Care and Equipment/Supplies: ?NA ? ?Functional Questionnaire: (I = Independent and D = Dependent) ?ADLs: I ? ?Follow up appointments reviewed: ? ?PCP Hospital f/u appt confirmed? Yes  Scheduled to see NP Edwards on 01/15/2022 @ 8:30 ?[facility] f/u appt confirmed? Yes   ?Are transportation arrangements needed? No  ?If their condition worsens, is the pt aware to call PCP or go to the Emergency Dept.? Yes ?Was the patient provided with contact information for the PCP's office or ED? Yes ?Was to pt encouraged to call back with questions or concerns? Yes  ?

## 2022-01-15 ENCOUNTER — Ambulatory Visit (INDEPENDENT_AMBULATORY_CARE_PROVIDER_SITE_OTHER): Payer: Self-pay | Admitting: Primary Care

## 2022-01-20 ENCOUNTER — Ambulatory Visit (INDEPENDENT_AMBULATORY_CARE_PROVIDER_SITE_OTHER): Payer: Self-pay

## 2022-01-20 VITALS — BP 131/75 | HR 80 | Wt 249.2 lb

## 2022-01-20 DIAGNOSIS — Z5189 Encounter for other specified aftercare: Secondary | ICD-10-CM

## 2022-01-20 NOTE — Progress Notes (Signed)
Patient is here today for incision check following a mini-laparotomy and right ovarian cystectomy on 01/12/22. Patient states pain is well controlled and currently denies any pain. Incision site was observed to be covered in a soiled honeycomb. Using adhesive remover honeycomb was removed without issue. Incision site observed to be clean, dry, and intact. No redness, swelling or drainage noted. Some yellow/purple colored bruising noted about an inch below the middle of incision. I instructed patient to allow warm soapy water to run over incision site in the shower. I reviewed signs and symptoms of infection with patient. I also reviewed patient's post-op appointment date and time with her. Patient verbalized understanding and denies any other questions.  ? ?Paulina Fusi, RN ?01/20/22 ?

## 2022-01-20 NOTE — Progress Notes (Signed)
No show

## 2022-01-27 ENCOUNTER — Encounter: Payer: Self-pay | Admitting: Radiology

## 2022-02-04 ENCOUNTER — Encounter: Payer: Self-pay | Admitting: Family Medicine

## 2022-02-04 ENCOUNTER — Ambulatory Visit (INDEPENDENT_AMBULATORY_CARE_PROVIDER_SITE_OTHER): Payer: Self-pay | Admitting: Family Medicine

## 2022-02-04 VITALS — BP 128/82 | HR 79 | Wt 250.1 lb

## 2022-02-04 DIAGNOSIS — Z09 Encounter for follow-up examination after completed treatment for conditions other than malignant neoplasm: Secondary | ICD-10-CM

## 2022-02-06 ENCOUNTER — Encounter: Payer: Self-pay | Admitting: Family Medicine

## 2022-02-06 NOTE — Progress Notes (Signed)
   Subjective:    Patient ID: Kathleen Cline is a 41 y.o. female presenting with Follow-up  on 02/04/2022  HPI: Here today for postop check. She is s/p mini-laparotomy for ovarian cystectomy after finding 40.7 x 11.6. 14.6 cm right ovarian cyst. Ovarian pathology shows serous cystadenoma. She is feeling well. Minimal pain. Normal bowel and bladder habits.  Review of Systems  Constitutional:  Negative for chills and fever.  Respiratory:  Negative for shortness of breath.   Cardiovascular:  Negative for chest pain.  Gastrointestinal:  Negative for abdominal pain, nausea and vomiting.  Genitourinary:  Negative for dysuria.  Skin:  Negative for rash.     Objective:    BP 128/82   Pulse 79   Wt 250 lb 1.6 oz (113.4 kg)   BMI 48.84 kg/m  Physical Exam Exam conducted with a chaperone present.  Constitutional:      General: She is not in acute distress.    Appearance: She is well-developed.  HENT:     Head: Normocephalic and atraumatic.  Eyes:     General: No scleral icterus. Cardiovascular:     Rate and Rhythm: Normal rate.  Pulmonary:     Effort: Pulmonary effort is normal.  Abdominal:     Palpations: Abdomen is soft.  Musculoskeletal:     Cervical back: Neck supple.  Skin:    General: Skin is warm and dry.  Neurological:     Mental Status: She is alert and oriented to person, place, and time.        Assessment & Plan:  Postop check - doing well, path reviewed   Return if symptoms worsen or fail to improve.  Donnamae Jude, MD 02/06/2022 6:27 PM

## 2022-02-09 ENCOUNTER — Encounter (INDEPENDENT_AMBULATORY_CARE_PROVIDER_SITE_OTHER): Payer: Self-pay

## 2022-02-09 ENCOUNTER — Ambulatory Visit (INDEPENDENT_AMBULATORY_CARE_PROVIDER_SITE_OTHER): Payer: Self-pay | Admitting: Primary Care

## 2022-02-11 ENCOUNTER — Encounter: Payer: Self-pay | Admitting: Family Medicine

## 2022-02-22 ENCOUNTER — Other Ambulatory Visit: Payer: Self-pay | Admitting: Obstetrics and Gynecology

## 2022-02-22 DIAGNOSIS — Z1231 Encounter for screening mammogram for malignant neoplasm of breast: Secondary | ICD-10-CM

## 2022-04-08 ENCOUNTER — Ambulatory Visit: Payer: Self-pay | Admitting: *Deleted

## 2022-04-08 ENCOUNTER — Ambulatory Visit
Admission: RE | Admit: 2022-04-08 | Discharge: 2022-04-08 | Disposition: A | Discharge status: Home / Self Care | Payer: No Typology Code available for payment source | Source: Ambulatory Visit | Attending: Obstetrics and Gynecology | Admitting: Obstetrics and Gynecology

## 2022-04-08 VITALS — BP 116/78 | Wt 243.5 lb

## 2022-04-08 DIAGNOSIS — Z01419 Encounter for gynecological examination (general) (routine) without abnormal findings: Secondary | ICD-10-CM

## 2022-04-08 DIAGNOSIS — Z1231 Encounter for screening mammogram for malignant neoplasm of breast: Secondary | ICD-10-CM

## 2022-04-08 NOTE — Progress Notes (Signed)
Kathleen Cline is a 41 y.o. [phone] female who presents to [facility] clinic today with no complaints.    Pap Smear: Pap smear completed today. Last Pap smear was 05/19/2018 at [facility] [place] [facility] clinic and was normal with negative HPV. Per patient has history of an abnormal Pap smear in 2014 that a colposcopy was completed for follow up. Patient stated that all Pap smears have been normal since colposcopy and that she has had at least three normal Pap smears. Last Pap smear result is available in Epic.   Physical exam: Breasts Breasts symmetrical. No skin abnormalities bilateral breasts. No nipple retraction bilateral breasts. No nipple discharge bilateral breasts. No lymphadenopathy. No lumps palpated bilateral breasts. No complaints of pain or tenderness on exam.       Pelvic/Bimanual Ext Genitalia No lesions, no swelling and no discharge observed on external genitalia.        Vagina Vagina pink and normal texture. No lesions or discharge observed in vagina.        Cervix Cervix is present. Cervix pink and of normal texture. No discharge observed.    Uterus Uterus is present and palpable. Uterus in normal position and normal size.        Adnexae Bilateral ovaries present and palpable. No tenderness on palpation.         Rectovaginal No rectal exam completed today since patient had no rectal complaints. No skin abnormalities observed on exam.     Smoking History: Patient has never smoked.   Patient Navigation: Patient education provided. Access to services provided for patient through [facility] program. Spanish interpreter Rudene Anda at [facility] provided.    Breast and Cervical Cancer Risk Assessment: Patient does not have family history of breast cancer, known genetic mutations, or radiation treatment to the chest before age 41. Per patient has history of cervical dysplasia. Patient has no history of being immunocompromised or DES  exposure in-utero.  Risk Assessment     Risk Scores       04/08/2022   Last edited by: Demetrius Revel, LPN   5-year risk: 0.3 %   Lifetime risk: 5.1 %            A: [facility] exam with pap smear No complaints.  P: Referred patient to the [place] for a screening mammogram on mobile unit. Appointment scheduled Thursday, April 08, 2022 at 1550.  Loletta Parish, RN 04/08/2022 3:27 PM

## 2022-04-08 NOTE — Patient Instructions (Signed)
Explained breast self awareness with Lajean Silvius. Pap smear completed today. Let her know [facility] will cover Pap smears and HPV typing every 5 years unless has a history of abnormal Pap smears. Referred patient to the [place] [place] for a screening mammogram on mobile unit. Appointment scheduled Thursday, April 08, 2022 at 1550. Patient aware of appointment and will be there. Let patient know will follow up with her within the next couple weeks with results of Pap smear by phone. Informed patient that the [facility] will follow up with her within the next couple of weeks with results of her mammogram by letter or phone. [place] verbalized understanding.  Jenyfer Trawick, Arvil Chaco, RN 3:27 PM

## 2022-04-12 LAB — CYTOLOGY - PAP
Comment: NEGATIVE
Diagnosis: NEGATIVE
High risk HPV: NEGATIVE

## 2022-04-13 ENCOUNTER — Telehealth: Payer: Self-pay | Admitting: Family Medicine

## 2022-04-13 NOTE — Telephone Encounter (Signed)
Patient called stating that she received two phone calls but I was unable to direct her to the correct person due to no documentation of two phone calls

## 2022-04-14 ENCOUNTER — Telehealth: Payer: Self-pay

## 2022-04-14 NOTE — Telephone Encounter (Signed)
Called patient via Kathleen Cline, [employer] to give pap smear results. Informed patient that pap smear was normal and HPV was negative. Based on this result her next pap smear will be due in 5 years. Patient voiced understanding.

## 2024-04-20 ENCOUNTER — Other Ambulatory Visit: Payer: Self-pay

## 2024-04-20 ENCOUNTER — Emergency Department (HOSPITAL_COMMUNITY)
Admission: EM | Admit: 2024-04-20 | Discharge: 2024-04-20 | Disposition: A | Discharge status: Home / Self Care | Payer: Self-pay | Attending: Emergency Medicine | Admitting: Emergency Medicine

## 2024-04-20 ENCOUNTER — Encounter (HOSPITAL_COMMUNITY): Payer: Self-pay

## 2024-04-20 DIAGNOSIS — N920 Excessive and frequent menstruation with regular cycle: Secondary | ICD-10-CM

## 2024-04-20 DIAGNOSIS — D649 Anemia, unspecified: Secondary | ICD-10-CM | POA: Insufficient documentation

## 2024-04-20 DIAGNOSIS — N939 Abnormal uterine and vaginal bleeding, unspecified: Secondary | ICD-10-CM | POA: Insufficient documentation

## 2024-04-20 LAB — CBC
HCT: 26.2 % — ABNORMAL LOW (ref 36.0–46.0)
Hemoglobin: 7.7 g/dL — ABNORMAL LOW (ref 12.0–15.0)
MCH: 22.7 pg — ABNORMAL LOW (ref 26.0–34.0)
MCHC: 29.4 g/dL — ABNORMAL LOW (ref 30.0–36.0)
MCV: 77.3 fL — ABNORMAL LOW (ref 80.0–100.0)
Platelets: 171 K/uL (ref 150–400)
RBC: 3.39 MIL/uL — ABNORMAL LOW (ref 3.87–5.11)
RDW: 17 % — ABNORMAL HIGH (ref 11.5–15.5)
WBC: 8.4 K/uL (ref 4.0–10.5)
nRBC: 0 % (ref 0.0–0.2)

## 2024-04-20 LAB — VITAMIN B12: Vitamin B-12: 325 pg/mL (ref 180–914)

## 2024-04-20 LAB — IRON AND TIBC
Iron: 15 ug/dL — ABNORMAL LOW (ref 28–170)
Saturation Ratios: 3 % — ABNORMAL LOW (ref 10.4–31.8)
TIBC: 511 ug/dL — ABNORMAL HIGH (ref 250–450)
UIBC: 496 ug/dL

## 2024-04-20 LAB — RETICULOCYTES
Immature Retic Fract: 16.9 % — ABNORMAL HIGH (ref 2.3–15.9)
RBC.: 4.55 MIL/uL (ref 3.87–5.11)
Retic Count, Absolute: 109.5 K/uL (ref 19.0–186.0)
Retic Ct Pct: 2.4 % (ref 0.4–3.1)

## 2024-04-20 LAB — FERRITIN: Ferritin: <3 ng/mL — ABNORMAL LOW (ref 11–307)

## 2024-04-20 LAB — HCG, SERUM, QUALITATIVE: Preg, Serum: NEGATIVE

## 2024-04-20 LAB — FOLATE: Folate: 18.1 ng/mL (ref >5.9)

## 2024-04-20 MED ORDER — FERROUS SULFATE 325 (65 FE) MG PO TBEC: 325.0000 mg | DELAYED_RELEASE_TABLET | ORAL | 3 refills | Status: AC

## 2024-04-20 MED ORDER — MEGESTROL ACETATE 40 MG PO TABS: 40.0000 mg | ORAL_TABLET | Freq: Every day | ORAL | Status: DC

## 2024-04-20 MED ORDER — MEGESTROL ACETATE 40 MG PO TABS
40.0000 mg | ORAL_TABLET | Freq: Every day | ORAL | Status: DC
  Administered 2024-04-20: 40 mg via ORAL
  Filled 2024-04-20: qty 1

## 2024-04-20 MED ORDER — MEGESTROL ACETATE 40 MG PO TABS: 40.0000 mg | ORAL_TABLET | Freq: Two times a day (BID) | ORAL | 3 refills | Status: DC

## 2024-04-21 ENCOUNTER — Emergency Department (HOSPITAL_COMMUNITY): Payer: Self-pay

## 2024-04-21 ENCOUNTER — Other Ambulatory Visit: Payer: Self-pay

## 2024-04-21 ENCOUNTER — Emergency Department (HOSPITAL_COMMUNITY)
Admission: EM | Admit: 2024-04-21 | Discharge: 2024-04-21 | Disposition: A | Discharge status: Home / Self Care | Payer: Self-pay | Attending: Emergency Medicine | Admitting: Emergency Medicine

## 2024-04-21 ENCOUNTER — Encounter (HOSPITAL_COMMUNITY): Payer: Self-pay | Admitting: Emergency Medicine

## 2024-04-21 DIAGNOSIS — D649 Anemia, unspecified: Secondary | ICD-10-CM | POA: Insufficient documentation

## 2024-04-21 DIAGNOSIS — N841 Polyp of cervix uteri: Secondary | ICD-10-CM | POA: Insufficient documentation

## 2024-04-21 DIAGNOSIS — N939 Abnormal uterine and vaginal bleeding, unspecified: Secondary | ICD-10-CM

## 2024-04-21 LAB — CBC WITH DIFFERENTIAL/PLATELET
Abs Granulocyte: 5.9 K/uL (ref 1.5–6.5)
Abs Immature Granulocytes: 0.05 K/uL (ref 0.00–0.07)
Basophils Absolute: 0 K/uL (ref 0.0–0.1)
Basophils Relative: 0 %
Eosinophils Absolute: 0.1 K/uL (ref 0.0–0.5)
Eosinophils Relative: 1 %
HCT: 26.2 % — ABNORMAL LOW (ref 36.0–46.0)
Hemoglobin: 7.6 g/dL — ABNORMAL LOW (ref 12.0–15.0)
Immature Granulocytes: 1 %
Lymphocytes Relative: 25 %
Lymphs Abs: 2.2 K/uL (ref 0.7–4.0)
MCH: 22.6 pg — ABNORMAL LOW (ref 26.0–34.0)
MCHC: 29 g/dL — ABNORMAL LOW (ref 30.0–36.0)
MCV: 78 fL — ABNORMAL LOW (ref 80.0–100.0)
Monocytes Absolute: 0.6 K/uL (ref 0.1–1.0)
Monocytes Relative: 7 %
Neutro Abs: 5.9 K/uL (ref 1.7–7.7)
Neutrophils Relative %: 66 %
Platelets: 169 K/uL (ref 150–400)
RBC: 3.36 MIL/uL — ABNORMAL LOW (ref 3.87–5.11)
RDW: 17 % — ABNORMAL HIGH (ref 11.5–15.5)
WBC: 8.9 K/uL (ref 4.0–10.5)
nRBC: 0.2 % (ref 0.0–0.2)

## 2024-04-21 LAB — BASIC METABOLIC PANEL WITH GFR
Anion gap: 8 (ref 5–15)
BUN: 14 mg/dL (ref 6–20)
CO2: 23 mmol/L (ref 22–32)
Calcium: 8.2 mg/dL — ABNORMAL LOW (ref 8.9–10.3)
Chloride: 106 mmol/L (ref 98–111)
Creatinine, Ser: 0.78 mg/dL (ref 0.44–1.00)
GFR, Estimated: >60 mL/min (ref >60)
Glucose, Bld: 97 mg/dL (ref 70–99)
Potassium: 3.6 mmol/L (ref 3.5–5.1)
Sodium: 137 mmol/L (ref 135–145)

## 2024-04-21 LAB — TYPE AND SCREEN
ABO/RH(D): A POS
Antibody Screen: NEGATIVE

## 2024-04-21 LAB — HIV ANTIBODY (ROUTINE TESTING W REFLEX): HIV Screen 4th Generation wRfx: NONREACTIVE

## 2024-04-21 MED ORDER — MEGESTROL ACETATE 40 MG PO TABS
40.0000 mg | ORAL_TABLET | Freq: Two times a day (BID) | ORAL | 0 refills | Status: DC
  Filled 2024-04-23: qty 14, 7d supply, fill #0

## 2024-04-21 MED ORDER — TRANEXAMIC ACID-NACL 1000-0.7 MG/100ML-% IV SOLN
1000.0000 mg | INTRAVENOUS | Status: AC
  Administered 2024-04-21: 1000 mg via INTRAVENOUS
  Filled 2024-04-21: qty 100

## 2024-04-21 MED ORDER — MEGESTROL ACETATE 40 MG PO TABS: 40.0000 mg | ORAL_TABLET | Freq: Two times a day (BID) | ORAL | 0 refills | Status: DC

## 2024-04-21 NOTE — ED Triage Notes (Signed)
 Pt was seen yesterday for same.  Reports going to the pharmacy and was only able to pick up the ferrous sulfate.  The other medication she was prescribed will not be available until Monday.  Pt reports bleeding has gotten worse.

## 2024-04-21 NOTE — ED Provider Notes (Signed)
 [place] EMERGENCY DEPARTMENT AT [facility] Provider Note   CSN: [id number] Arrival date & time: 04/21/24  1614     Patient presents with: Vaginal Bleeding   Kathleen Cline is a 43 y.o. female.   Hx obtained via spanish interpreter  43 yo F with hx of vaginal bleeding who presents with vaginal bleeding. Has been going on for 3 days. Has been going through 10-12 pads per day. Passing blood clots. Feeling weak. Says that she came in yesterday and was seen but unfortunately do not see the results of that visit.  Says she did not have an ultrasound. She went to pick up her megace  and iron and she went to the pharmacy and wasn't able to pick up the megace  because it wasn't ready till Monday. Did get the iron. Has had heavy bleeding in the past but resolved in 2023 when they removed her ovarian cyst. Is currently on her menstrual cycle.  No history of DVT or PE.  Does not smoke.  Not on blood thinners.       Prior to Admission medications   Medication Sig Start Date End Date Taking? Authorizing Provider  acetaminophen  (TYLENOL ) 500 MG tablet Take 1 tablet (500 mg total) by mouth every 6 (six) hours as needed. Patient not taking: Reported on 01/20/2022 01/10/22   Redwine, Madison A, PA-C  docusate sodium  (COLACE) 100 MG capsule Take 1 capsule (100 mg total) by mouth 2 (two) times daily as needed for mild constipation or moderate constipation. 01/13/22   Anyanwu, Ugonna A, MD  ibuprofen  (ADVIL ) 600 MG tablet Take 1 tablet (600 mg total) by mouth every 6 (six) hours as needed for moderate pain, mild pain or headache. 01/13/22   Anyanwu, Ugonna A, MD  megestrol  (MEGACE ) 40 MG tablet Take 1 tablet (40 mg total) by mouth 2 (two) times daily for 7 days. 04/21/24 04/28/24  Yolande Lamar BROCKS, MD  oxyCODONE  (OXY IR/ROXICODONE ) 5 MG immediate release tablet Take 1 tablet (5 mg total) by mouth every 4 (four) hours as needed for severe pain or breakthrough pain. 01/13/22   Anyanwu,  Ugonna A, MD    Allergies: Egg-derived products    Review of Systems  Updated Vital Signs BP 139/75 (BP Location: Left Arm)   Pulse 79   Temp 98.4 F (36.9 C) (Oral)   Resp 16   SpO2 100%   Physical Exam Vitals and nursing note reviewed.  Constitutional:      General: She is not in acute distress.    Appearance: She is well-developed.  HENT:     Head: Normocephalic and atraumatic.     Right Ear: External ear normal.     Left Ear: External ear normal.     Nose: Nose normal.  Eyes:     Extraocular Movements: Extraocular movements intact.     Conjunctiva/sclera: Conjunctivae normal.     Pupils: Pupils are equal, round, and reactive to light.     Comments: Pale conjunctival  Cardiovascular:     Rate and Rhythm: Normal rate and regular rhythm.     Heart sounds: No murmur heard. Pulmonary:     Effort: Pulmonary effort is normal. No respiratory distress.     Breath sounds: Normal breath sounds.  Abdominal:     General: Abdomen is flat. There is no distension.     Palpations: Abdomen is soft. There is no mass.     Tenderness: There is no abdominal tenderness. There is no guarding.  Musculoskeletal:     Cervical back: Normal range of motion and neck supple.  Neurological:     Mental Status: She is alert.     (all labs ordered are listed, but only abnormal results are displayed) Labs Reviewed  CBC WITH DIFFERENTIAL/PLATELET - Abnormal; Notable for the following components:      Result Value   RBC 3.36 (*)    Hemoglobin 7.6 (*)    HCT 26.2 (*)    MCV 78.0 (*)    MCH 22.6 (*)    MCHC 29.0 (*)    RDW 17.0 (*)    All other components within normal limits  BASIC METABOLIC PANEL WITH GFR - Abnormal; Notable for the following components:   Calcium 8.2 (*)    All other components within normal limits  TYPE AND SCREEN    EKG: None  Radiology: US  PELVIC COMPLETE WITH TRANSVAGINAL Result Date: 04/21/2024 CLINICAL DATA:  [id number] Vaginal bleeding [id number] EXAM:  TRANSABDOMINAL AND TRANSVAGINAL ULTRASOUND OF PELVIS TECHNIQUE: Both transabdominal and transvaginal ultrasound examinations of the pelvis were performed. Transabdominal technique was performed for global imaging of the pelvis including uterus, ovaries, adnexal regions, and pelvic cul-de-sac. It was necessary to proceed with endovaginal exam following the transabdominal exam to visualize the uterus, endometrium, and ovaries. COMPARISON:  Jan 11, 2022 FINDINGS: Uterus Measurements: 12.9 x 7.5 x 6.4 cm = volume: 326 mL. No fibroids or other mass visualized. Endometrium Thickness: 5 mm. No focal abnormality visualized. Heterogeneous lesion within the cervix, measuring 2.9 x 2.1 x 2.7 cm. Right ovary The structure measured by the sonographer is not definitely the right ovary is no follicles or discrete ovarian stroma was appreciated. Left ovary Obscured by overlying bowel gas. Other findings No free pelvic fluid. IMPRESSION: 1. While the endometrium is not thickened, there is a heterogeneous lesion within the endocervical canal, measuring 2.9 x 2.1 x 2.7 cm. This could represent a cervical fibroid, a prolapsed submucosal uterine fibroid, or a cervical polyp. Correlation with physical exam and laboratory findings recommended to exclude a cervical neoplasm. 2. Neither ovary was well visualized or evaluated. Electronically Signed   By: Rogelia Myers M.D.   On: 04/21/2024 17:48     .Pelvic exam  Date/Time: 04/21/2024 7:12 PM  Performed by: Yolande Lamar BROCKS, MD Authorized by: Yolande Lamar BROCKS, MD  Consent: Verbal consent obtained Comments: Chaperoned by Vina tech.  External genitalia unremarkable. Nor rashes or lesions noted.  Speculum exam with minimal to moderate amount of blood.  Vaginal wall mucosa is unremarkable.  Cervix incompletely visualized but no masses appreciated  Bimanual exam without cervical motion tenderness, adnexal tenderness or any masses appreciated.       Medications  Ordered in the ED  tranexamic acid  (CYKLOKAPRON ) IVPB 1,000 mg (1,000 mg Intravenous New Bag/Given 04/21/24 1727)    Clinical Course as of 04/21/24 1927  Sat Apr 21, 2024  1732 Called [facility] on [address]. They do have megace  in stock and patient can pick it up today.  [RP]  1739 Hemoglobin(!): 7.6 7.7 yesterday [RP]    Clinical Course User Index [RP] Yolande Lamar BROCKS, MD                                 Medical Decision Making Amount and/or Complexity of Data Reviewed Labs: ordered. Decision-making details documented in ED Course. Radiology: ordered.  Risk Prescription drug management.   [facility] Kathleen Cline is a 43  y.o. female with comorbidities that complicate the patient evaluation including premenses who presents emergency department with vaginal bleeding  Initial Ddx:  Anemia, vaginal bleeding, mass, heavy menses, coagulopathy  MDM/Course:  Presents emergency department.  Vaginal bleeding over the past few days.  Is currently on her menstrual cycle.  Does have a history of heavy menses but reports that they have been normal up until this cycle.  Seen in the emergency department yesterday and had hemoglobin of 7.7.  She was discharged home with Megace  but has been able to pick it up and has been having some bleeding since then.  On exam is not in acute distress.  Her vital signs are reassuring.  She does have some pale conjunctive bilaterally.  Lab work shows that her hemoglobin is 7.6 today.  It is otherwise unremarkable.  Since she has not had imaging in years did obtain an ultrasound which does show a cervical polyp.  On exam unable to visualize this.  Unable to palpate a mass.  Was given TXA and upon re-evaluation bleeding had slowed.  Do not feel that she necessarily needs a transfusion at this point in time and since her hemoglobin has only dropped 0.1 points and she is not severely symptomatic.  Did [facility] [address] who confirmed that they have  the medication in stock.  Will have her follow-up with OB/GYN as an outpatient.  Was informed about the mass and the need to follow-up in case it is cancerous  This patient presents to the ED for concern of complaints listed in HPI, this involves an extensive number of treatment options, and is a complaint that carries with it a high risk of complications and morbidity. Disposition including potential need for admission considered.   Dispo: DC Home. Return precautions discussed including, but not limited to, those listed in the AVS. Allowed pt time to ask questions which were answered fully prior to dc.  Additional history obtained from family Records reviewed Outpatient Clinic Notes The following labs were independently interpreted: CBC and show chronic anemia I have reviewed the patients home medications and made adjustments as needed Social Determinants of health:   Spanish speaking only  Portions of this note were generated with Scientist, clinical (histocompatibility and immunogenetics). Dictation errors may occur despite best attempts at proofreading.    Final diagnoses:  Vaginal bleeding  Anemia, unspecified type  Cervical polyp    ED Discharge Orders          Ordered    megestrol  (MEGACE ) 40 MG tablet  2 times daily,   Status:  Discontinued        04/21/24 1733    megestrol  (MEGACE ) 40 MG tablet  2 times daily        04/21/24 1733               Yolande Lamar BROCKS, MD 04/21/24 [phone]

## 2024-04-21 NOTE — Discharge Instructions (Signed)
 Fue atendida en urgencias por sangrado vaginal.  En casa, tome Tylenol  e ibuprofeno para los clicos. Tome el Megace  que le recetamos hasta su cita de seguimiento con su gineclogo.  Consulte su MyChart en lnea para ver los [employer] pruebas que no haban dado resultados al salir de Oceanographer.  Consulte su cita de seguimiento con su gineclogo en 2 o 3 das sobre su visita.  Hable con ellos sobre el plipo cervical que se observ.  Regrese de inmediato a urgencias si experimenta alguno de los siguientes sntomas: empapa ms de 2 toallas sanitarias por hora durante 2 horas (4 toallas sanitarias en total y 2 horas) o cualquier otro sntoma preocupante.  Gracias por visitar [place]. Fue un Engineer, manufacturing systems.  ---  You were seen for your vaginal bleeding in the emergency department.   At home, please take Tylenol  and ibuprofen  for any cramping that you have.  Take the Megace  we have prescribed you until you follow-up with your OB/GYN.  Check your MyChart online for the results of any tests that had not resulted by the time you left the emergency department.   Follow-up with your OB/GYN in 2-3 days regarding your visit.  Talk to them about the cervical polyp that was seen.  Return immediately to the emergency department if you experience any of the following: Soaking through more than 2 pads per hour for 2 hours (4 pads total and 2 hours), or any other concerning symptoms.    Thank you for visiting our Emergency Department. It was a pleasure taking care of you today.

## 2024-04-23 ENCOUNTER — Encounter (HOSPITAL_COMMUNITY): Payer: Self-pay | Admitting: Emergency Medicine

## 2024-04-23 ENCOUNTER — Other Ambulatory Visit (HOSPITAL_COMMUNITY): Payer: Self-pay

## 2024-04-23 LAB — GC/CHLAMYDIA PROBE AMP (~~LOC~~) NOT AT ARMC
Chlamydia: NEGATIVE
Comment: NEGATIVE
Comment: NORMAL
Neisseria Gonorrhea: NEGATIVE

## 2024-04-24 ENCOUNTER — Encounter (HOSPITAL_COMMUNITY): Payer: Self-pay | Admitting: Emergency Medicine

## 2024-04-24 ENCOUNTER — Emergency Department (HOSPITAL_COMMUNITY)
Admission: EM | Admit: 2024-04-24 | Discharge: 2024-04-25 | Disposition: A | Discharge status: Home / Self Care | Attending: Emergency Medicine | Admitting: Emergency Medicine

## 2024-04-24 ENCOUNTER — Other Ambulatory Visit: Payer: Self-pay

## 2024-04-24 DIAGNOSIS — D649 Anemia, unspecified: Secondary | ICD-10-CM | POA: Insufficient documentation

## 2024-04-24 DIAGNOSIS — N939 Abnormal uterine and vaginal bleeding, unspecified: Secondary | ICD-10-CM

## 2024-04-24 LAB — BASIC METABOLIC PANEL WITH GFR
Anion gap: 10 (ref 5–15)
BUN: 15 mg/dL (ref 6–20)
CO2: 23 mmol/L (ref 22–32)
Calcium: 8.6 mg/dL — ABNORMAL LOW (ref 8.9–10.3)
Chloride: 105 mmol/L (ref 98–111)
Creatinine, Ser: 0.54 mg/dL (ref 0.44–1.00)
GFR, Estimated: >60 mL/min (ref >60)
Glucose, Bld: 124 mg/dL — ABNORMAL HIGH (ref 70–99)
Potassium: 4.2 mmol/L (ref 3.5–5.1)
Sodium: 138 mmol/L (ref 135–145)

## 2024-04-24 LAB — CBC
HCT: 20.9 % — ABNORMAL LOW (ref 36.0–46.0)
Hemoglobin: 6.2 g/dL — CL (ref 12.0–15.0)
MCH: 23.4 pg — ABNORMAL LOW (ref 26.0–34.0)
MCHC: 29.7 g/dL — ABNORMAL LOW (ref 30.0–36.0)
MCV: 78.9 fL — ABNORMAL LOW (ref 80.0–100.0)
Platelets: 168 K/uL (ref 150–400)
RBC: 2.65 MIL/uL — ABNORMAL LOW (ref 3.87–5.11)
RDW: 19.3 % — ABNORMAL HIGH (ref 11.5–15.5)
WBC: 8.3 K/uL (ref 4.0–10.5)
nRBC: 0.8 % — ABNORMAL HIGH (ref 0.0–0.2)

## 2024-04-24 LAB — HCG, SERUM, QUALITATIVE: Preg, Serum: NEGATIVE

## 2024-04-24 NOTE — ED Triage Notes (Signed)
 Pt in with reported vaginal bleeding since 8/7 - seen a few times since, states she began taking Megace  on 8/10 and the bleeding has now turned to massive clots, now baseball sized reportedly. +dizziness reported

## 2024-04-24 NOTE — ED Triage Notes (Signed)
 States she has been having vaginal bleeding since Thursday. Has been taking medications prescribed by doctor but says its not working.

## 2024-04-25 LAB — PREPARE RBC (CROSSMATCH)

## 2024-04-25 MED ORDER — SODIUM CHLORIDE 0.9% IV SOLUTION: Freq: Once | INTRAVENOUS | Status: AC

## 2024-04-25 MED ORDER — MEDROXYPROGESTERONE ACETATE 5 MG PO TABS: ORAL_TABLET | ORAL | 0 refills | Status: DC

## 2024-04-25 MED ORDER — HYDROCODONE-ACETAMINOPHEN 5-325 MG PO TABS
1.0000 | ORAL_TABLET | Freq: Once | ORAL | Status: AC
  Administered 2024-04-25 (×2): 1 via ORAL
  Filled 2024-04-25: qty 1

## 2024-04-25 NOTE — ED Provider Notes (Signed)
 [place] EMERGENCY DEPARTMENT AT [facility] Provider Note   CSN: [id number] Arrival date & time: 04/24/24  2221     Patient presents with: Vaginal Bleeding   Kathleen Cline is a 43 y.o. female.  Patient with history significant for previous laparotomy for ovarian cyst removal, obesity, GERD presents to the emergency department with concerns over continued vaginal bleeding.  She states that she has had vaginal bleeding since August 7.  She was seen in the emergency department twice since onset and was prescribed Megace .  She states that she has continued to bleed since taking the Megace .  She has an appointment with OB/GYN but it is not until the beginning of September.  She does endorse some lightheadedness associated with her bleeding.  She denies chest pain, shortness of breath, abdominal pain.    Vaginal Bleeding      Prior to Admission medications   Medication Sig Start Date End Date Taking? Authorizing Provider  medroxyPROGESTERone  (PROVERA ) 5 MG tablet Take 4 tablets (20 mg total) by mouth 3 (three) times daily for 7 days, THEN 4 tablets (20 mg total) daily for 21 days. 04/25/24 05/23/24 Yes Logan Ubaldo NOVAK, PA-C  acetaminophen  (TYLENOL ) 500 MG tablet Take 1 tablet (500 mg total) by mouth every 6 (six) hours as needed. Patient not taking: Reported on 01/20/2022 01/10/22   Redwine, Madison A, PA-C  docusate sodium  (COLACE) 100 MG capsule Take 1 capsule (100 mg total) by mouth 2 (two) times daily as needed for mild constipation or moderate constipation. 01/13/22   Anyanwu, Ugonna A, MD  ferrous sulfate 325 (65 FE) MG EC tablet Take 1 tablet (325 mg total) by mouth every other day. 04/20/24   Fredirick Glenys RAMAN, MD  ibuprofen  (ADVIL ) 600 MG tablet Take 1 tablet (600 mg total) by mouth every 6 (six) hours as needed for moderate pain, mild pain or headache. 01/13/22   Anyanwu, Ugonna A, MD  oxyCODONE  (OXY IR/ROXICODONE ) 5 MG immediate release tablet Take 1 tablet (5 mg  total) by mouth every 4 (four) hours as needed for severe pain or breakthrough pain. 01/13/22   Anyanwu, Ugonna A, MD    Allergies: Egg-derived products    Review of Systems  Genitourinary:  Positive for vaginal bleeding.    Updated Vital Signs BP 124/69   Pulse 84   Temp 98.7 F (37.1 C) (Oral)   Resp 15   Wt 110.5 kg   SpO2 100%   BMI 47.58 kg/m   Physical Exam Vitals and nursing note reviewed.  Constitutional:      General: She is not in acute distress.    Appearance: She is well-developed.  HENT:     Head: Normocephalic and atraumatic.     Right Ear: External ear normal.     Left Ear: External ear normal.     Nose: Nose normal.  Eyes:     Extraocular Movements: Extraocular movements intact.     Conjunctiva/sclera: Conjunctivae normal.     Pupils: Pupils are equal, round, and reactive to light.     Comments: Pale conjunctival  Cardiovascular:     Rate and Rhythm: Normal rate and regular rhythm.     Heart sounds: No murmur heard. Pulmonary:     Effort: Pulmonary effort is normal. No respiratory distress.     Breath sounds: Normal breath sounds.  Abdominal:     General: Abdomen is flat. There is no distension.     Palpations: Abdomen is soft. There is  no mass.     Tenderness: There is no abdominal tenderness. There is no guarding.  Musculoskeletal:     Cervical back: Normal range of motion and neck supple.  Neurological:     Mental Status: She is alert.     (all labs ordered are listed, but only abnormal results are displayed) Labs Reviewed  CBC - Abnormal; Notable for the following components:      Result Value   RBC 2.65 (*)    Hemoglobin 6.2 (*)    HCT 20.9 (*)    MCV 78.9 (*)    MCH 23.4 (*)    MCHC 29.7 (*)    RDW 19.3 (*)    nRBC 0.8 (*)    All other components within normal limits  BASIC METABOLIC PANEL WITH GFR - Abnormal; Notable for the following components:   Glucose, Bld 124 (*)    Calcium 8.6 (*)    All other components within normal  limits  HCG, SERUM, QUALITATIVE  TYPE AND SCREEN  PREPARE RBC (CROSSMATCH)    EKG: None  Radiology: No results found.   .Critical Care  Performed by: Logan Ubaldo NOVAK, PA-C Authorized by: Logan Ubaldo NOVAK, PA-C   Critical care provider statement:    Critical care time (minutes):  30   Critical care time was exclusive of:  Separately billable procedures and treating other patients   Critical care was necessary to treat or prevent imminent or life-threatening deterioration of the following conditions: Symptomatic anemia requiring transfusion.   Critical care was time spent personally by me on the following activities:  Development of treatment plan with patient or surrogate, discussions with consultants, evaluation of patient's response to treatment, examination of patient, ordering and review of laboratory studies, ordering and review of radiographic studies, ordering and performing treatments and interventions, pulse oximetry, re-evaluation of patient's condition and review of old charts    Medications Ordered in the ED  0.9 %  sodium chloride  infusion (Manually program via Guardrails IV Fluids) (0 mLs Intravenous Stopped 04/25/24 0330)  HYDROcodone -acetaminophen  (NORCO/VICODIN) 5-325 MG per tablet 1 tablet (1 tablet Oral Given 04/25/24 0448)                                    Medical Decision Making Amount and/or Complexity of Data Reviewed Labs: ordered.  Risk Prescription drug management.   This patient presents to the ED for concern of vaginal bleeding, this involves an extensive number of treatment options, and is a complaint that carries with it a high risk of complications and morbidity.    Co morbidities / Chronic conditions that complicate the patient evaluation  Obesity   Additional history obtained:  Additional history obtained from EMR   Lab Tests:  I Ordered, and personally interpreted labs.  The pertinent results include:  Hemoglobin 6.2, negative  pregnancy test   Imaging Studies ordered:  I reviewed the previous ultrasound performed on August 8 with no significant acute finding to explain patient's bleeding    Problem List / ED Course / Critical interventions / Medication management   I ordered medication including Norco, 1 unit PRBC Reevaluation of the patient after these medicines showed that the patient improved I have reviewed the patients home medicines and have made adjustments as needed   Social Determinants of Health:  Patient is self-pay   Test / Admission - Considered:  Patient with vaginal bleeding which has not been responsive  to Megace .  Plan to prescribe Provera  and have patient follow-up with OB/GYN.  Patient transfused with 1 unit PRBC for symptomatic anemia.  I do not feel the patient needs admission at this time.  She appears stable for discharge home.  Return precautions have been provided.      Final diagnoses:  Symptomatic anemia  Abnormal uterine bleeding (AUB)    ED Discharge Orders          Ordered    medroxyPROGESTERone  (PROVERA ) 5 MG tablet  Multiple Frequencies        04/25/24 0623               Logan Ubaldo NOVAK, PA-C 04/25/24 9375    Haze Lonni PARAS, MD 04/26/24 [phone]

## 2024-04-25 NOTE — Discharge Instructions (Addendum)
 Suspenda el Megace  que le recetaron anteriormente y tome la nueva receta de Provera . Consulte con su gineclogo para recibir ms atencin. Si presenta sntomas que pongan en peligro su vida, acuda a urgencias.  Please discontinue the Megace  that was previously prescribed and take the new prescription for Provera .  Follow-up with GYN for further management.  If you develop any life-threatening symptoms return to the emergency department.

## 2024-04-25 NOTE — ED Notes (Signed)
 Pelvic cart and translator to bedside.

## 2024-04-26 LAB — BPAM RBC
Blood Product Expiration Date: 202509062359
ISSUE DATE / TIME: 202508130317
Unit Type and Rh: 6200

## 2024-04-26 LAB — TYPE AND SCREEN
ABO/RH(D): A POS
Antibody Screen: NEGATIVE
Unit division: 0

## 2024-04-27 ENCOUNTER — Ambulatory Visit (HOSPITAL_COMMUNITY)
Admission: RE | Admit: 2024-04-27 | Discharge: 2024-04-27 | Disposition: A | Discharge status: Home / Self Care | Source: Ambulatory Visit | Attending: Family Medicine | Admitting: Family Medicine

## 2024-04-27 DIAGNOSIS — D649 Anemia, unspecified: Secondary | ICD-10-CM | POA: Insufficient documentation

## 2024-04-27 DIAGNOSIS — N939 Abnormal uterine and vaginal bleeding, unspecified: Secondary | ICD-10-CM | POA: Insufficient documentation

## 2024-05-07 ENCOUNTER — Encounter: Payer: Self-pay | Admitting: Obstetrics and Gynecology

## 2024-05-07 ENCOUNTER — Ambulatory Visit (INDEPENDENT_AMBULATORY_CARE_PROVIDER_SITE_OTHER): Payer: Self-pay | Admitting: Obstetrics and Gynecology

## 2024-05-07 VITALS — BP 124/72 | HR 82 | Wt 248.0 lb

## 2024-05-07 DIAGNOSIS — D500 Iron deficiency anemia secondary to blood loss (chronic): Secondary | ICD-10-CM

## 2024-05-07 MED ORDER — MEDROXYPROGESTERONE ACETATE 5 MG PO TABS
ORAL_TABLET | ORAL | 1 refills | Status: DC
  Filled 2024-05-17: qty 36, 3d supply, fill #0
  Filled 2024-05-17: qty 132, 25d supply, fill #0

## 2024-05-07 NOTE — Progress Notes (Signed)
 43 yo P2 with LMP 04/09/24 here for ED follow up for symptomatic anemia. Patient reports a history of a monthly period lasting 4 days. At the end of July, she experienced her cycle that was heavier than her norm with passage of clots and lasted for almost 14 days. Patient reports good response with high dose provera . She had a repeat ultrasound 04/27/24 which has not been read yet. 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, removed 01/12/2022   Past Surgical History:  Procedure Laterality Date   LAPAROTOMY N/A 01/12/2022   Procedure: LAPAROTOMY;  Surgeon: Herchel Gloris LABOR, MD;  Location: [facility] OR;  Service: Gynecology;  Laterality: N/A;   OVARIAN CYST REMOVAL N/A 01/12/2022   Procedure: RIGHT OVARIAN CYSTECTOMY AND SALPINGECTOMY;  Surgeon: Herchel Gloris LABOR, MD;  Location: [facility] OR;  Service: Gynecology;  Laterality: N/A;   Family History  Problem Relation Age of Onset   Diabetes Mother    Social History   Tobacco Use   Smoking status: Never   Smokeless tobacco: Never  Vaping Use   Vaping status: Never Used  Substance Use Topics   Alcohol use: No   Drug use: Never   ROS See pertinent in HPI. All other systems reviewed and non contributory Blood pressure 124/72, pulse 82, weight 248 lb (112.5 kg), last menstrual period 04/09/2024. GENERAL: Well-developed, well-nourished female in no acute distress.  NEURO: alert and oriented x 3  US  PELVIC COMPLETE WITH TRANSVAGINAL Result Date: 04/21/2024 CLINICAL DATA:  [id number] Vaginal bleeding [id number] EXAM: TRANSABDOMINAL AND TRANSVAGINAL ULTRASOUND OF PELVIS TECHNIQUE: Both transabdominal and transvaginal ultrasound examinations of the pelvis were performed. Transabdominal technique was performed for global imaging of the pelvis including uterus, ovaries, adnexal regions, and pelvic cul-de-sac. It was  necessary to proceed with endovaginal exam following the transabdominal exam to visualize the uterus, endometrium, and ovaries. COMPARISON:  Jan 11, 2022 FINDINGS: Uterus Measurements: 12.9 x 7.5 x 6.4 cm = volume: 326 mL. No fibroids or other mass visualized. Endometrium Thickness: 5 mm. No focal abnormality visualized. Heterogeneous lesion within the cervix, measuring 2.9 x 2.1 x 2.7 cm. Right ovary The structure measured by the sonographer is not definitely the right ovary is no follicles or discrete ovarian stroma was appreciated. Left ovary Obscured by overlying bowel gas. Other findings No free pelvic fluid. IMPRESSION: 1. While the endometrium is not thickened, there is a heterogeneous lesion within the endocervical canal, measuring 2.9 x 2.1 x 2.7 cm. This could represent a cervical fibroid, a prolapsed submucosal uterine fibroid, or a cervical polyp. Correlation with physical exam and laboratory findings recommended to exclude a cervical neoplasm. 2. Neither ovary was well visualized or evaluated. Electronically Signed   By: Rogelia Myers M.D.   On: 04/21/2024 17:48     A/P 43 yo with AUB - Patient wishes to continue with provera  for now- refill provided - Will follow up with radiology on follow up ultrasound - Discussed possible need for hysteroscopy for definitive evaluation and treatment. Patient is hesitant - Will repeat cbc today

## 2024-05-07 NOTE — Progress Notes (Signed)
 Pt was seen in the ED for vaginal bleeding, bleeding has gotten better with Provera  use.  Pt states this is the first time this has occurred - has bleeding for 2 weeks.  Normal cycle is 3-4 days.  Pt states she has a lot of fatigue, esp after walking/activity.  Pt did have an u/s at an ED visit and was told she had an abnormality - please discuss.

## 2024-05-08 ENCOUNTER — Ambulatory Visit: Payer: Self-pay | Admitting: Obstetrics and Gynecology

## 2024-05-08 LAB — CBC
Hematocrit: 25.7 % — ABNORMAL LOW (ref 34.0–46.6)
Hemoglobin: 7.2 g/dL — ABNORMAL LOW (ref 11.1–15.9)
MCH: 22.6 pg — ABNORMAL LOW (ref 26.6–33.0)
MCHC: 28 g/dL — ABNORMAL LOW (ref 31.5–35.7)
MCV: 81 fL (ref 79–97)
NRBC: 1 % — ABNORMAL HIGH (ref 0–0)
Platelets: 159 x10E3/uL (ref 150–450)
RBC: 3.18 x10E6/uL — ABNORMAL LOW (ref 3.77–5.28)
RDW: 18.7 % — ABNORMAL HIGH (ref 11.7–15.4)
WBC: 7.6 x10E3/uL (ref 3.4–10.8)

## 2024-05-10 ENCOUNTER — Telehealth: Payer: Self-pay

## 2024-05-10 NOTE — Telephone Encounter (Signed)
 TC to pt about results, and recommendations from Dr. Alger with interpretor.

## 2024-05-17 ENCOUNTER — Other Ambulatory Visit (HOSPITAL_COMMUNITY): Payer: Self-pay

## 2024-05-18 ENCOUNTER — Other Ambulatory Visit: Payer: Self-pay

## 2024-05-18 ENCOUNTER — Other Ambulatory Visit (HOSPITAL_COMMUNITY): Payer: Self-pay

## 2024-05-20 ENCOUNTER — Other Ambulatory Visit (HOSPITAL_COMMUNITY): Payer: Self-pay

## 2024-05-21 ENCOUNTER — Other Ambulatory Visit (HOSPITAL_COMMUNITY): Payer: Self-pay

## 2024-06-06 ENCOUNTER — Other Ambulatory Visit: Payer: Self-pay

## 2024-06-06 ENCOUNTER — Other Ambulatory Visit (HOSPITAL_COMMUNITY): Payer: Self-pay

## 2024-06-06 ENCOUNTER — Ambulatory Visit (INDEPENDENT_AMBULATORY_CARE_PROVIDER_SITE_OTHER): Payer: Self-pay | Admitting: Obstetrics & Gynecology

## 2024-06-06 VITALS — BP 130/80 | HR 76 | Wt 239.0 lb

## 2024-06-06 DIAGNOSIS — D250 Submucous leiomyoma of uterus: Secondary | ICD-10-CM

## 2024-06-06 MED ORDER — MEDROXYPROGESTERONE ACETATE 5 MG PO TABS
ORAL_TABLET | ORAL | 1 refills | Status: AC
  Filled 2024-06-06: qty 168, 28d supply, fill #0
  Filled 2024-07-11: qty 168, 28d supply, fill #1

## 2024-06-06 NOTE — Progress Notes (Signed)
 Patient ID: Kathleen Cline, female   DOB: 04-23-1981, 43 y.o.   MRN: [id number]  Chief Complaint  Patient presents with   Menorrhagia    HPI Kathleen Cline is a 43 y.o. female.  [id number] Patient's last menstrual period was 04/09/2024. 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  HPI  Past Medical History:  Diagnosis Date   Hyperlipidemia    Morbid obesity (HCC)    Serous cystadenoma    Involving part of right ovary and fallopian tube, removed 01/12/2022    Past Surgical History:  Procedure Laterality Date   LAPAROTOMY N/A 01/12/2022   Procedure: LAPAROTOMY;  Surgeon: Herchel Gloris LABOR, MD;  Location: [facility] OR;  Service: Gynecology;  Laterality: N/A;   OVARIAN CYST REMOVAL N/A 01/12/2022   Procedure: RIGHT OVARIAN CYSTECTOMY AND SALPINGECTOMY;  Surgeon: Herchel Gloris LABOR, MD;  Location: [facility] OR;  Service: Gynecology;  Laterality: N/A;    Family History  Problem Relation Age of Onset   Diabetes Mother     Social History Social History   Tobacco Use   Smoking status: Never   Smokeless tobacco: Never  Vaping Use   Vaping status: Never Used  Substance Use Topics   Alcohol use: No   Drug use: Never    Allergies  Allergen Reactions   Egg-Derived Products Nausea And Vomiting    Current Outpatient Medications  Medication Sig Dispense Refill   ferrous sulfate 325 (65 FE) MG EC tablet Take 1 tablet (325 mg total) by mouth every other day. 45 tablet 3   ibuprofen  (ADVIL ) 600 MG tablet Take 1 tablet (600 mg total) by mouth every 6 (six) hours as needed for moderate pain, mild pain or headache. 60 tablet 2   acetaminophen  (TYLENOL ) 500 MG tablet Take 1 tablet (500 mg total) by mouth every 6 (six) hours as needed. (Patient not taking: Reported on 06/06/2024) 30 tablet 0   docusate sodium  (COLACE) 100 MG capsule Take 1 capsule (100 mg total) by mouth 2 (two) times daily as needed for mild constipation or moderate  constipation. (Patient not taking: Reported on 06/06/2024) 30 capsule 2   medroxyPROGESTERone  (PROVERA ) 5 MG tablet Take 4 tablets (20 mg total) by mouth 3 (three) times daily for 7 days, THEN 4 tablets (20 mg total) daily for 21 days. 168 tablet 1   oxyCODONE  (OXY IR/ROXICODONE ) 5 MG immediate release tablet Take 1 tablet (5 mg total) by mouth every 4 (four) hours as needed for severe pain or breakthrough pain. (Patient not taking: Reported on 06/06/2024) 30 tablet 0   No current facility-administered medications for this visit.    Review of Systems Review of Systems  Constitutional: Negative.   Respiratory: Negative.    Cardiovascular: Negative.   Genitourinary:  Negative for pelvic pain and vaginal bleeding.    Blood pressure 130/80, pulse 76, weight 239 lb (108.4 kg), last menstrual period 04/09/2024.  Physical Exam Physical Exam Vitals and nursing note reviewed. Exam conducted with a chaperone present.  Constitutional:      Appearance: She is obese.  Cardiovascular:     Rate and Rhythm: Normal rate.  Pulmonary:     Effort: Pulmonary effort is normal.  Skin:    General: Skin is warm and dry.  Neurological:     Mental Status: She is alert.  Psychiatric:        Mood and Affect: Mood normal.        Behavior: Behavior normal.  Data Reviewed Narrative & Impression  CLINICAL DATA:  Abnormal uterine bleeding cyst removal of right ovary   EXAM: TRANSABDOMINAL AND TRANSVAGINAL ULTRASOUND OF PELVIS   TECHNIQUE: Both transabdominal and transvaginal ultrasound examinations of the pelvis were performed. Transabdominal technique was performed for global imaging of the pelvis including uterus, ovaries, adnexal regions, and pelvic cul-de-sac. It was necessary to proceed with endovaginal exam following the transabdominal exam to visualize the uterus, endometrium, ovaries, and adnexa.   COMPARISON:  Pelvic ultrasound, 04/21/2024 MR pelvis, 01/11/2022   FINDINGS: Uterus    Measurements: 12.7 x 7.3 x 9.2 cm = volume: 45 mL. Heterogeneous mass within the endocervical canal, probably a pedunculated fibroid, measuring 3.3 x 2.0 x 2.7 cm. Additional smaller submucosal fibroids of the fundus.   Endometrium   Thickness: 1.1 cm.  No focal abnormality visualized.   Right ovary   Measurements: 4.2 x 2.7 x 3.2 cm = volume: 19 mL. Normal appearance/no adnexal mass. Small functional cysts and follicles.   Left ovary   Not visualized.   Other findings   No abnormal free fluid.   IMPRESSION: 1. Heterogeneous mass within the endocervical canal, probably a pedunculated fibroid, measuring 3.3 x 2.0 x 2.7 cm. This has significantly increased in size when compared to MR examination dated 01/11/2022. 2. Additional smaller submucosal fibroids of the fundus. 3. Endometrial thickness of 1.1 cm. No focal abnormality visualized. 4. Normal sonographic appearance of the right ovary. 5. Left ovary not visualized.     Electronically Signed   By: Marolyn JONETTA Jaksch M.D.   On: 05/07/2024 17:00    Assessment Submucous leiomyoma of uterus - Plan: medroxyPROGESTERone  (PROVERA ) 5 MG tablet   Plan Meds ordered this encounter  Medications   medroxyPROGESTERone  (PROVERA ) 5 MG tablet    Sig: Take 4 tablets (20 mg total) by mouth 3 (three) times daily for 7 days, THEN 4 tablets (20 mg total) daily for 21 days.    Dispense:  168 tablet    Refill:  1   RTC after applying for financial assistance possible Medicaid Candidate for hysteroscopic myomectomy   Lynwood Solomons 06/06/2024, 2:18 PM

## 2024-06-10 ENCOUNTER — Other Ambulatory Visit (HOSPITAL_COMMUNITY): Payer: Self-pay

## 2024-07-11 ENCOUNTER — Other Ambulatory Visit (HOSPITAL_COMMUNITY): Payer: Self-pay

## 2024-07-19 ENCOUNTER — Ambulatory Visit: Admitting: Obstetrics and Gynecology
